# Patient Record
Sex: Female | Born: 1985 | Hispanic: Yes | Marital: Married | Smoking: Never smoker
Health system: Southern US, Community
[De-identification: ages and names within clinical notes are randomized; demographics above are authoritative.]

## PROBLEM LIST (undated history)

## (undated) ENCOUNTER — Inpatient Hospital Stay (HOSPITAL_COMMUNITY): Payer: Self-pay

## (undated) DIAGNOSIS — G43909 Migraine, unspecified, not intractable, without status migrainosus: Secondary | ICD-10-CM

## (undated) DIAGNOSIS — N289 Disorder of kidney and ureter, unspecified: Secondary | ICD-10-CM

## (undated) DIAGNOSIS — D649 Anemia, unspecified: Secondary | ICD-10-CM

## (undated) HISTORY — PX: WISDOM TOOTH EXTRACTION: SHX21

---

## 2013-06-26 ENCOUNTER — Emergency Department (HOSPITAL_COMMUNITY)
Admission: EM | Admit: 2013-06-26 | Discharge: 2013-06-26 | Disposition: A | Discharge status: Home / Self Care | Payer: Self-pay | Attending: Emergency Medicine | Admitting: Emergency Medicine

## 2013-06-26 ENCOUNTER — Encounter (HOSPITAL_COMMUNITY): Payer: Self-pay | Admitting: Emergency Medicine

## 2013-06-26 DIAGNOSIS — L738 Other specified follicular disorders: Secondary | ICD-10-CM | POA: Insufficient documentation

## 2013-06-26 DIAGNOSIS — N750 Cyst of Bartholin's gland: Secondary | ICD-10-CM | POA: Insufficient documentation

## 2013-06-26 DIAGNOSIS — L739 Follicular disorder, unspecified: Secondary | ICD-10-CM

## 2013-06-26 MED ORDER — HYDROCODONE-ACETAMINOPHEN 5-325 MG PO TABS
1.0000 | ORAL_TABLET | Freq: Once | ORAL | Status: AC
  Administered 2013-06-26: 1 via ORAL
  Filled 2013-06-26: qty 1

## 2013-06-26 MED ORDER — CLINDAMYCIN HCL 150 MG PO CAPS: 450.0000 mg | ORAL_CAPSULE | Freq: Four times a day (QID) | ORAL | Status: DC

## 2013-06-26 NOTE — ED Provider Notes (Signed)
CSN: [id number]     Arrival date & time 06/26/13  1021 History  This chart was scribed for non-physician practitioner, Raymon Mutton, PA-C, working with Juliet Rude. Rubin Payor, MD by Shari Heritage, ED Scribe. This patient was seen in room TR08C/TR08C and the patient's care was started at 12:56 PM.    Chief Complaint  Patient presents with  . Abscess    The history is provided by the patient. No language interpreter was used.    HPI Comments: Carmen Wheeler is a 27 y.o. female who presents to the Emergency Department complaining of multiple worsening painful non-draining abscesses to her labia onset 3-4 days ago. She describes current pain as severe. She states that these areas are hard to the touch. There is no associated fever. She reports no relieving factors. She denies nausea, vomiting, dysuria, abdominal pain, chest pain, shortness of breath, chills, neck pain, or other symptoms. She says that she has no history of the same. She has no chronic medical conditions.    No past medical history on file. Past Surgical History  Procedure Laterality Date  . Cesarean section     No family history on file. History  Substance Use Topics  . Smoking status: Not on file  . Smokeless tobacco: Not on file  . Alcohol Use: Not on file   OB History   Grav Para Term Preterm Abortions TAB SAB Ect Mult Living                 Review of Systems  Constitutional: Negative for fever and chills.  Respiratory: Negative for shortness of breath.   Cardiovascular: Negative for chest pain.  Gastrointestinal: Negative for nausea, vomiting and abdominal pain.  Musculoskeletal: Negative for neck pain.       Positive for abscesses to labia.   Neurological: Negative for dizziness.  All other systems reviewed and are negative.    Allergies  Review of patient's allergies indicates no known allergies.  Home Medications   Current Outpatient Rx  Name  Route  Sig  Dispense  Refill  . acetaminophen  (TYLENOL) 325 MG tablet   Oral   Take 650 mg by mouth every 6 (six) hours as needed for mild pain or headache.         . levonorgestrel (MIRENA) 20 MCG/24HR IUD   Intrauterine   1 each by Intrauterine route once.         . clindamycin (CLEOCIN) 150 MG capsule   Oral   Take 3 capsules (450 mg total) by mouth 4 (four) times daily.   90 capsule   0    Triage Vitals: BP 108/51  Pulse 77  Temp(Src) 98.3 F (36.8 C) (Oral)  Resp 18  SpO2 100% Physical Exam  Nursing note and vitals reviewed. Constitutional: She is oriented to person, place, and time. She appears well-developed and well-nourished. No distress.  HENT:  Head: Normocephalic and atraumatic.  Neck: Normal range of motion. Neck supple.  Cardiovascular: Normal rate, regular rhythm and normal heart sounds.  Exam reveals no friction rub.   No murmur heard. Pulmonary/Chest: Effort normal and breath sounds normal. No respiratory distress. She has no wheezes. She has no rales.  Genitourinary:  Approximately 2 cm x 2 cm indurated abscess localized to the left Bartholin's cyst region. Indurated, extremely hard to the touch. Negative surrounding erythema. Discomfort upon palpation. Negative active drainage or bleeding noted.  Neurological: She is alert and oriented to person, place, and time. She exhibits normal muscle tone. Coordination  normal.  Skin: Skin is warm and dry. No rash noted. She is not diaphoretic. No erythema.  Psychiatric: She has a normal mood and affect. Her behavior is normal. Thought content normal.    ED Course  Procedures (including critical care time) DIAGNOSTIC STUDIES: Oxygen Saturation is 100% on room air, normal by my interpretation.    COORDINATION OF CARE: 1:00 PM- Patient informed of current plan for treatment and evaluation and agrees with plan at this time.   INCISION AND DRAINAGE Performed by: Raymon Mutton Consent: Verbal consent obtained. Risks and benefits: risks, benefits and  alternatives were discussed Type: abscess Body area: Left Bartholin abscess Anesthesia: local infiltration Incision was made with a scalpel. Local anesthetic: lidocaine 1 % without epinephrine Anesthetic total: 5 ml Complexity: complex Blunt dissection to break up loculations Drainage: purulent and blood Drainage amount: 6-8 Patient tolerance: Patient tolerated the procedure well with no immediate complications.     Labs Review Labs Reviewed - No data to display Imaging Review No results found.  EKG Interpretation   None       MDM   1. Bartholin gland cyst   2. Folliculitis    Filed Vitals:   06/26/13 1027  BP: 108/51  Pulse: 77  Temp: 98.3 F (36.8 C)  TempSrc: Oral  Resp: 18  SpO2: 100%   I personally performed the services described in this documentation, which was scribed in my presence. The recorded information has been reviewed and is accurate.  Patient presenting to emergency department with Bartholin cyst abscess. Alert and oriented. Approximately 2 cm x 2 cm Bartholin cyst abscess to the left labial. Indurated upon palpation. Negative surrounding erythema noted. Discomfort upon palpation. Incision and drainage performed status blood and platelet fluid identified-hemostat used to break up loculations. Wound irrigated. Patient tolerated procedure well. Site covered with gauze. Patient stable, afebrile. Discharged patient with antibiotics. Discussed with patient hot compressions and massage. Recommended patient to followup report back to emergency department next week to be reassessed. Discussed with patient to closely monitor symptoms and if symptoms are to worsen or change report back to emergency department  - strict return structures given. Patient agreed to plan of care, understood, all questions answered.   Raymon Mutton, PA-C 06/26/13 1630

## 2013-06-26 NOTE — ED Notes (Signed)
PA at bedside.

## 2013-06-26 NOTE — ED Notes (Signed)
Pt has bumps on her labia, one she reports to be very large and painful. Afebrile. No drainage reported.

## 2013-06-26 NOTE — ED Notes (Signed)
Pt brought back to FT 8 until a stretcher room comes available.

## 2013-06-29 NOTE — ED Provider Notes (Signed)
Medical screening examination/treatment/procedure(s) were performed by non-physician practitioner and as supervising physician I was immediately available for consultation/collaboration.  EKG Interpretation   None        Rocket Gunderson R. Markevius Trombetta, MD 06/29/13 0835 

## 2014-06-22 ENCOUNTER — Emergency Department (HOSPITAL_COMMUNITY)
Admission: EM | Admit: 2014-06-22 | Discharge: 2014-06-22 | Disposition: A | Discharge status: Home / Self Care | Payer: Self-pay | Attending: Emergency Medicine | Admitting: Emergency Medicine

## 2014-06-22 ENCOUNTER — Encounter (HOSPITAL_COMMUNITY): Payer: Self-pay | Admitting: *Deleted

## 2014-06-22 ENCOUNTER — Emergency Department (HOSPITAL_COMMUNITY): Payer: Self-pay

## 2014-06-22 DIAGNOSIS — R200 Anesthesia of skin: Secondary | ICD-10-CM | POA: Insufficient documentation

## 2014-06-22 DIAGNOSIS — M79672 Pain in left foot: Secondary | ICD-10-CM | POA: Insufficient documentation

## 2014-06-22 DIAGNOSIS — Z791 Long term (current) use of non-steroidal anti-inflammatories (NSAID): Secondary | ICD-10-CM | POA: Insufficient documentation

## 2014-06-22 MED ORDER — MELOXICAM 7.5 MG PO TABS: 7.5000 mg | ORAL_TABLET | Freq: Every day | ORAL | Status: DC

## 2014-06-22 MED ORDER — TRAMADOL HCL 50 MG PO TABS: 50.0000 mg | ORAL_TABLET | Freq: Four times a day (QID) | ORAL | Status: DC | PRN

## 2014-06-22 NOTE — ED Notes (Addendum)
left. Foot (ant.) swelling - feels like pins and needles, cramping, numbing, and feels cold. Unilateral paraesthesia.

## 2014-06-22 NOTE — ED Notes (Signed)
Pt c/o pain to top of left foot x's 1-2 weeks.  No known injury.  Pt st's in the mornings when she woke up feels like pins and needles in bottom of foot.

## 2014-06-22 NOTE — ED Notes (Signed)
Talked with Dr. Jeraldine LootsLockwood - pt. Fast track appropriate.

## 2014-06-22 NOTE — ED Provider Notes (Signed)
CSN: [id number]     Arrival date & time 06/22/14  1651 History  This chart was scribed for non-physician practitioner, Terri Piedraourtney Forcucci, PA-C, working with Layla MawKristen N Ward, DO, by Bronson CurbJacqueline Melvin, ED Scribe. This patient was seen in room TR06C/TR06C and the patient's care was started at 7:53 PM.   Chief Complaint  Patient presents with  . Foot Pain    The history is provided by the patient. No language interpreter was used.     HPI Comments: Carmen Wheeler is a 28 y.o. female who presents to the Emergency Department complaining of constant, pain to the top of left foot for the past week. She states the pain started as mild tenderness, but has gotten progressively worse. She denies injury, athletic lifestyle, or any changes in her daily activities. There is associated swelling, redness, and paresthesia every morning while weight bearing. She has tried massages, applying ice, and Icy Hot, and Tylenol without significant improvement. She denies fever, chills, nausea, vomiting, or back pain.  Patient is not established with a PCP. NKA to any mediations.   History reviewed. No pertinent past medical history. Past Surgical History  Procedure Laterality Date  . Cesarean section     History reviewed. No pertinent family history. History  Substance Use Topics  . Smoking status: Never Smoker   . Smokeless tobacco: Not on file  . Alcohol Use: No   OB History    No data available     Review of Systems  Constitutional: Negative for fever and chills.  Gastrointestinal: Negative for nausea and vomiting.  Musculoskeletal: Positive for myalgias (left foot). Negative for back pain.  Neurological: Positive for numbness.  All other systems reviewed and are negative.     Allergies  Review of patient's allergies indicates no known allergies.  Home Medications   Prior to Admission medications   Medication Sig Start Date End Date Taking? Authorizing Provider  acetaminophen (TYLENOL)  325 MG tablet Take 650 mg by mouth every 6 (six) hours as needed for mild pain or headache.   Yes Historical Provider, MD  medroxyPROGESTERone (DEPO-PROVERA) 150 MG/ML injection Inject 150 mg into the muscle every 3 (three) months.   Yes Historical Provider, MD  meloxicam (MOBIC) 7.5 MG tablet Take 1 tablet (7.5 mg total) by mouth daily. 06/22/14   Beryl Hornberger A Forcucci, PA-C  traMADol (ULTRAM) 50 MG tablet Take 1 tablet (50 mg total) by mouth every 6 (six) hours as needed. 06/22/14   Eloise Picone A Forcucci, PA-C   Triage Vitals: BP 100/61 mmHg  Pulse 82  Temp(Src) 98.6 F (37 C) (Oral)  Resp 15  SpO2 100%  Physical Exam  Constitutional: She is oriented to person, place, and time. She appears well-developed and well-nourished. No distress.  HENT:  Head: Normocephalic and atraumatic.  Mouth/Throat: Oropharynx is clear and moist. No oropharyngeal exudate.  Eyes: Conjunctivae and EOM are normal. Pupils are equal, round, and reactive to light.  Neck: Normal range of motion. Neck supple. No JVD present. No thyromegaly present.  Cardiovascular: Normal rate, regular rhythm, normal heart sounds and intact distal pulses.  Exam reveals no gallop and no friction rub.   No murmur heard. Pulses:      Dorsalis pedis pulses are 2+ on the right side, and 2+ on the left side.       Posterior tibial pulses are 2+ on the right side, and 2+ on the left side.  2+ DP and PT pulses bilaterally.  Pulmonary/Chest: Effort normal and breath sounds normal.  No respiratory distress.  Abdominal: Soft. Bowel sounds are normal.  Musculoskeletal: Normal range of motion.       Left ankle: She exhibits normal range of motion, no swelling, no ecchymosis, no deformity, no laceration and normal pulse. Tenderness. No lateral malleolus, no medial malleolus, no AITFL, no CF ligament, no posterior TFL, no head of 5th metatarsal and no proximal fibula tenderness found.       Feet:  Lymphadenopathy:    She has no cervical adenopathy.   Neurological: She is alert and oriented to person, place, and time.  Skin: Skin is warm and dry.  Psychiatric: She has a normal mood and affect. Her behavior is normal. Judgment and thought content normal.  Nursing note and vitals reviewed.   ED Course  Procedures (including critical care time)  DIAGNOSTIC STUDIES: Oxygen Saturation is 100% on room air, normal by my interpretation.    COORDINATION OF CARE: At [id number] Discussed treatment plan with patient which includes X-ray to rule out possible fracture, anti-inflammatory medication.  Patient also advised to refrain from sitting with legs crossed. Patient agrees.   Labs Review Labs Reviewed - No data to display  Imaging Review Dg Foot Complete Left  06/22/2014   CLINICAL DATA:  Pt states pains across the anterior mid foot, when she walks it is more painful, feels pins and needles after a long day on her feet, she has a standing job, no visible sts, mk rt-r  EXAM: LEFT FOOT - COMPLETE 3+ VIEW  COMPARISON:  None.  FINDINGS: There is no evidence of fracture or dislocation. There is no evidence of arthropathy or other focal bone abnormality. Soft tissues are unremarkable.  IMPRESSION: Negative.   Electronically Signed   By: Elberta Fortisaniel  Boyle M.D.   On: 06/22/2014 20:48     EKG Interpretation None      MDM   Final diagnoses:  Foot pain, left   Patient is a 28 y.o. Female who presents to the ED with left foot pain.  Physical exam reveals a neurovascularly intact left foot.  Plain film xray is negative. Suspect that parasthesias of the foot are likely due to leg crossing and have recommended that the patient does not cross legs.  Suspect foot sprain vs. Lis franc injury.  Patient is stable for discharge will place her on daily mobic and will have her follow-up with ortho if continued pain in 1 week.  Patient states understanding and agreement at this time.    I personally performed the services described in this documentation, which was  scribed in my presence. The recorded information has been reviewed and is accurate.     Eben Burowourtney A Forcucci, PA-C 06/22/14 2115  Layla MawKristen N Ward, DO 06/23/14 0005

## 2014-06-22 NOTE — Discharge Instructions (Signed)
Foot Sprain The muscles and cord like structures which attach muscle to bone (tendons) that surround the feet are made up of units. A foot sprain can occur at the weakest spot in any of these units. This condition is most often caused by injury to or overuse of the foot, as from playing contact sports, or aggravating a previous injury, or from poor conditioning, or obesity. SYMPTOMS  Pain with movement of the foot.  Tenderness and swelling at the injury site.  Loss of strength is present in moderate or severe sprains. THE THREE GRADES OR SEVERITY OF FOOT SPRAIN ARE:  Mild (Grade I): Slightly pulled muscle without tearing of muscle or tendon fibers or loss of strength.  Moderate (Grade II): Tearing of fibers in a muscle, tendon, or at the attachment to bone, with small decrease in strength.  Severe (Grade III): Rupture of the muscle-tendon-bone attachment, with separation of fibers. Severe sprain requires surgical repair. Often repeating (chronic) sprains are caused by overuse. Sudden (acute) sprains are caused by direct injury or over-use. DIAGNOSIS  Diagnosis of this condition is usually by your own observation. If problems continue, a caregiver may be required for further evaluation and treatment. X-rays may be required to make sure there are not breaks in the bones (fractures) present. Continued problems may require physical therapy for treatment. PREVENTION  Use strength and conditioning exercises appropriate for your sport.  Warm up properly prior to working out.  Use athletic shoes that are made for the sport you are participating in.  Allow adequate time for healing. Early return to activities makes repeat injury more likely, and can lead to an unstable arthritic foot that can result in prolonged disability. Mild sprains generally heal in 3 to 10 days, with moderate and severe sprains taking 2 to 10 weeks. Your caregiver can help you determine the proper time required for  healing. HOME CARE INSTRUCTIONS   Apply ice to the injury for 15-20 minutes, 03-04 times per day. Put the ice in a plastic bag and place a towel between the bag of ice and your skin.  An elastic wrap (like an Ace bandage) may be used to keep swelling down.  Keep foot above the level of the heart, or at least raised on a footstool, when swelling and pain are present.  Try to avoid use other than gentle range of motion while the foot is painful. Do not resume use until instructed by your caregiver. Then begin use gradually, not increasing use to the point of pain. If pain does develop, decrease use and continue the above measures, gradually increasing activities that do not cause discomfort, until you gradually achieve normal use.  Use crutches if and as instructed, and for the length of time instructed.  Keep injured foot and ankle wrapped between treatments.  Massage foot and ankle for comfort and to keep swelling down. Massage from the toes up towards the knee.  Only take over-the-counter or prescription medicines for pain, discomfort, or fever as directed by your caregiver. SEEK IMMEDIATE MEDICAL CARE IF:   Your pain and swelling increase, or pain is not controlled with medications.  You have loss of feeling in your foot or your foot turns cold or blue.  You develop new, unexplained symptoms, or an increase of the symptoms that brought you to your caregiver. MAKE SURE YOU:   Understand these instructions.  Will watch your condition.  Will get help right away if you are not doing well or get worse. Document Released:  01/19/2002 Document Revised: 10/22/2011 Document Reviewed: 03/18/2008 °ExitCare® Patient Information ©2015 [employer]. This information is not intended to replace advice given to you by your health care provider. Make sure you discuss any questions you have with your health care provider. ° ° °Emergency Department Resource Guide °1) Find a Doctor and Pay Out of  Pocket °Although you won't have to find out who is covered by your insurance plan, it is a good idea to ask around and get recommendations. You will then need to call the office and see if the doctor you have chosen will accept you as a new patient and what types of options they offer for patients who are self-pay. Some doctors offer discounts or will set up payment plans for their patients who do not have insurance, but you will need to ask so you aren't surprised when you get to your appointment. ° °2) Contact Your Local Health Department °Not all health departments have doctors that can see patients for sick visits, but many do, so it is worth a call to see if yours does. If you don't know where your local health department is, you can check in your phone book. The CDC also has a tool to help you locate your state's health department, and many state websites also have listings of all of their local health departments. ° °3) Find a Walk-in Clinic °If your illness is not likely to be very severe or complicated, you may want to try a walk in clinic. These are popping up all over the country in pharmacies, drugstores, and shopping centers. They're usually staffed by nurse practitioners or physician assistants that have been trained to treat common illnesses and complaints. They're usually fairly quick and inexpensive. However, if you have serious medical issues or chronic medical problems, these are probably not your best option. ° °No Primary Care Doctor: °- Call Health Connect at  832-8000 - they can help you locate a primary care doctor that  accepts your insurance, provides certain services, etc. °- Physician Referral Service- 1-800-533-3463 ° °Chronic Pain Problems: °Organization         Address  Phone   Notes  °[facility]  ([phone] Patients need to be referred by their primary care doctor.  ° °Medication Assistance: °Organization         Address  Phone   Notes  °[place]  Medication Assistance [facility] [address] °[place], [place] [address] ([phone]) 641-8030 --Must be a resident of [place] °-- Must have NO insurance coverage whatsoever (no Medicaid/ Medicare, etc.) °-- The pt. MUST have a primary care doctor that directs their care regularly and follows them in the community °  °Med[facility]ist  (866) 331-1348   °United Way  (888) 892-1162   ° °Agencies that provide inexpensive medical care: °Organization         Address  Phone   Notes  °[facility]  ([phone]) [phone]   ° Internal Medicine    (336) 832-7272   °[facility] [address] °[place], [place] [address] ([phone]) 832-4777   °[facility] [address], °Opp (336) 271-4999   °Planned Parenthood    (336) 373-0678   °[facility]    (336) 272-1050   °[facility] ° [address], [place] Phone:  (336) [phone], Fax:  ([phone]) [phone] Hours of Operation:  9 am - 6 pm, M-F.  Also   accepts Medicaid/Medicare and self-pay.  [facility] for [place], [address], [place] Phone: [phone], Fax: [phone]. Hours of Operation:  8:30 am - 5:30 pm, M-F.  Also accepts Medicaid and self-pay.  [facility] [address], [place] Phone: [phone]   [place], [place], [place] [phone], Ext. [phone] Mondays & Thursdays: 7-9 AM.  First 15 patients are seen on a first come, first serve basis.    [place] Providers:  Organization         Address  Phone   Notes  [facility] [address], [place] [phone] Also accepts self-pay patients.  [facility] [address], [place]  ([phone]   [place], [address], [place] [phone]   [facility] [address] [place] [phone]   Lucianne Lei [address], [place]   [phone] Only accepts [place] Access [place] patients after they have their name applied to their card.   Self-Pay (no insurance) in [facility]:  Organization         Address  Phone   Notes  Sickle Cell Patients, [facility] [place] [phone]   [facility] [place] [phone]   [facility]  [place], [place], [place] [phone]   [facility]/Dr. Osei-Bonsu  [address], [place] or [address], [place] [phone] Phone number for both [place] and [place] locations is the same.  [facility] [address] [place] [phone]   [facility] [address] [place] or [address] [phone]   [facility] [address] [place] [phone], phone; [phone], fax Sees patients 1st and 3rd Saturday of every month.  Must not qualify for public or private insurance (i.e. Medicaid, Medicare, [place] Health Choice, Veterans' Benefits)  Household income should be no more than 200% of the poverty level The clinic cannot treat you if you are pregnant or think you are pregnant  Sexually transmitted diseases are not treated at the clinic.    Dental Care: Organization         Address  Phone  Notes  [facility] [place] [phone] Accepts children up to age 47 who are enrolled in [place] or [place]; pregnant women with a Medicaid card; and children who have applied for Medicaid or [place] Health Choice, but were declined, whose parents can pay a reduced fee at time of service.  [facility] [place] [facility]  [address], [place]  [phone] Accepts children up to age 72 who are enrolled in [place] or [place]; pregnant women with a Medicaid card; and children who have applied for Medicaid or [place] Health Choice, but were declined, whose parents can pay a reduced fee at time of service.  [facility]  [place] [phone] Patients are seen by appointment only. Walk-ins are not accepted. [place] will see patients 43 years of age and older. Monday - Tuesday (8am-5pm)  Most Wednesdays (8:30-5pm) $30 per visit, cash only  [facility] [employer] PRO[facility]  [address], [facility] [phone] Patients are seen by appointment only. Walk-ins are not accepted. [place] will see patients 68 years of age and older. One Wednesday Evening (Monthly: Volunteer Based).  $30 per visit, cash only  [place]  [phone] for adults; Children under age 54, call Graduate Pediatric Dentistry at [phone]. Children aged 70-14, please call [phone] to request a pediatric application.  Dental services are provided in all areas of dental care including fillings, crowns and bridges, complete and partial dentures, implants, gum treatment, root canals, and extractions. Preventive care is also provided. Treatment is provided to both adults and children. Patients are selected via a lottery and there is often a waiting list.   [facility] [place] [address] [place]  [phone] [email or web address]   [facility] [address] [place], [place] [phone], Ext. [phone] Second and Fourth Thursday of each month, opens at 6:30 AM; Clinic ends at 9 AM.  Patients are seen on a first-come first-served basis, and a limited number are seen during each clinic.   [facility]  [address] Hillard Danker [place], [place] [phone]   Eligibility Requirements You must have lived in [place], [place], or [place]  counties for at least the last three months.   You cannot be eligible for state or federal sponsored [employer], including [employer], [place], or [employer].   You generally cannot be eligible for healthcare insurance through your employer.    How to apply: Eligibility screenings are held every Tuesday and Wednesday afternoon from 1:00 pm until 4:00 pm. You do not need an appointment for the interview!  [facility] [address] [place], [place]   [place]  [place] Department  [place]  [phone]    Behavioral Health Resources in the Community: Intensive Outpatient Programs Organization         Address  Phone  Notes  [place] [address] [place], [place] [phone]   [facility] [address] [place], [place]   [facility]: [facility] [address] [place], [place]   [place] [address]  [place], [place] or [phone]   Substance Abuse Resources Organization         Address  Phone  Notes  Al[facility] Drug Services  7162251982   [place]  [phone]   [facility] [place]   Chinita Pester  [phone]   Residential & Outpatient Substance Abuse Program  [phone]   Psychological Services Organization         Address  Phone  Notes  [facility] [place]  [place]  ([phone]   [place] [address], [place] or [phone]    Mobile Crisis Teams Organization         Address  Phone  Notes  [facility], Mobile Crisis Care Unit  [phone]   [facility]  [address] [place], [place]   [place] [address], [place] [phone]    Self-Help/Support Groups Organization         Address  Phone  Notes  [facility] [place] - variety of support groups  336- I7437963 Call for more information  Narcotics Anonymous (NA), [facility] [address], [employer] [place]  2 meetings at this location   Statistician         Address  Phone  Notes  [facility] [address],    [place]  [phone]   [facility]  [address] [place] [address], [place], [place] [phone]   [facility] [address] [place], [place] [phone] Admissions: 8am-3pm M-F  [facility] [address]    [place], [place] [phone]   [facility] [address] [place], [place], [place] [phone]   [facility] [address]  [place], [place] [phone]   [facility] - Intensive Outpatient [address]ure[address], [place], [place] [phone]   [facility] [address] [place].,  In[place], [place] [phone] or [phone]   [facility] ([facility]) [address] [place], [place] [phone] Accepts Medicaid  [facility] [place] [address],  [place] [phone] Substance Abuse/Addiction Treatment   [facility] Organization         Address  Phone  Notes  [facility]  (820) [phone]   Angie Fava, PhD [address] Ervin Knack [place], [place]   (828) [phone] or [phone]   [facility] Behavioral   [address] [place], [place] [phone]   [facility] [address], [place], [place] [phone] Insurance/Medicaid/sponsorship through [facility] and Families [address]                                    [place], [place] [phone] Therapy/tele-psych/case  [facility] [address], [place] [phone]    Dr. Lolly Mustache  [phone]   [facility] [place]  United Way [facility] Dept. 1) [address],  2) [address], [place] 3)  [address], [place] [phone]  [phone]   [facility] Child Abuse Hotline 575-805-8158 or ([phone] (After Hours)

## 2015-03-12 ENCOUNTER — Emergency Department (HOSPITAL_COMMUNITY)
Admission: EM | Admit: 2015-03-12 | Discharge: 2015-03-12 | Disposition: A | Discharge status: Home / Self Care | Payer: BLUE CROSS/BLUE SHIELD | Attending: Emergency Medicine | Admitting: Emergency Medicine

## 2015-03-12 ENCOUNTER — Encounter (HOSPITAL_COMMUNITY): Payer: Self-pay | Admitting: *Deleted

## 2015-03-12 DIAGNOSIS — H538 Other visual disturbances: Secondary | ICD-10-CM | POA: Diagnosis not present

## 2015-03-12 DIAGNOSIS — Z3202 Encounter for pregnancy test, result negative: Secondary | ICD-10-CM | POA: Insufficient documentation

## 2015-03-12 DIAGNOSIS — R42 Dizziness and giddiness: Secondary | ICD-10-CM | POA: Insufficient documentation

## 2015-03-12 DIAGNOSIS — H5500 Unspecified nystagmus: Secondary | ICD-10-CM | POA: Insufficient documentation

## 2015-03-12 DIAGNOSIS — R51 Headache: Secondary | ICD-10-CM | POA: Diagnosis not present

## 2015-03-12 DIAGNOSIS — R112 Nausea with vomiting, unspecified: Secondary | ICD-10-CM | POA: Diagnosis not present

## 2015-03-12 LAB — CBC
HEMATOCRIT: 37.7 % (ref 36.0–46.0)
HEMOGLOBIN: 12.7 g/dL (ref 12.0–15.0)
MCH: 29.5 pg (ref 26.0–34.0)
MCHC: 33.7 g/dL (ref 30.0–36.0)
MCV: 87.5 fL (ref 78.0–100.0)
Platelets: 209 10*3/uL (ref 150–400)
RBC: 4.31 MIL/uL (ref 3.87–5.11)
RDW: 13 % (ref 11.5–15.5)
WBC: 9.9 10*3/uL (ref 4.0–10.5)

## 2015-03-12 LAB — BASIC METABOLIC PANEL
Anion gap: 9 (ref 5–15)
BUN: 13 mg/dL (ref 6–20)
CHLORIDE: 107 mmol/L (ref 101–111)
CO2: 23 mmol/L (ref 22–32)
Calcium: 9.2 mg/dL (ref 8.9–10.3)
Creatinine, Ser: 0.59 mg/dL (ref 0.44–1.00)
GFR calc Af Amer: >60 mL/min (ref >60)
Glucose, Bld: 91 mg/dL (ref 65–99)
POTASSIUM: 3.6 mmol/L (ref 3.5–5.1)
SODIUM: 139 mmol/L (ref 135–145)

## 2015-03-12 LAB — POC URINE PREG, ED: Preg Test, Ur: NEGATIVE

## 2015-03-12 MED ORDER — MECLIZINE HCL 25 MG PO TABS
25.0000 mg | ORAL_TABLET | Freq: Once | ORAL | Status: AC
  Administered 2015-03-12: 25 mg via ORAL
  Filled 2015-03-12: qty 1

## 2015-03-12 MED ORDER — MECLIZINE HCL 25 MG PO TABS: 25.0000 mg | ORAL_TABLET | Freq: Two times a day (BID) | ORAL | Status: DC | PRN

## 2015-03-12 NOTE — ED Notes (Signed)
Patient C/O dizziness that began 3 days ago.  States that it feels like the room spins even with her eyes closed.  States that the headache onset was yesterday.  She denies any trauma.  States that she became nauseated and vomited yesterday.

## 2015-03-12 NOTE — ED Notes (Signed)
Patient with reported onset of frontal headache with dizziness and nausea for 3 days.  She denies trauma.  She states she has periods of hot and cold as well.  She denies fever.  She reports she has also had blurred vision.  Patient denies neck pain.  She is alert and oriented.  Speech is clear

## 2015-03-12 NOTE — ED Provider Notes (Signed)
CSN: [id number]     Arrival date & time 03/12/15  1624 History   First MD Initiated Contact with Patient 03/12/15 1809     Chief Complaint  Patient presents with  . Headache  . Dizziness  . Nausea  . Blurred Vision     (Consider location/radiation/quality/duration/timing/severity/associated sxs/prior Treatment) Patient is a 29 y.o. female presenting with dizziness.  Dizziness Quality:  Room spinning Severity:  Moderate Onset quality:  Gradual Duration:  3 days Timing:  Constant Progression:  Waxing and waning Chronicity:  New Context comment:  "I was just sitting there" Relieved by:  Nothing Worsened by:  Lying down, turning head, standing up and closing eyes Associated symptoms: headaches, nausea, vision changes (occasional blurriness.  none currently.) and vomiting (yesterday)   Associated symptoms: no chest pain, no diarrhea, no hearing loss, no shortness of breath and no tinnitus     History reviewed. No pertinent past medical history. Past Surgical History  Procedure Laterality Date  . Cesarean section     No family history on file. History  Substance Use Topics  . Smoking status: Never Smoker   . Smokeless tobacco: Not on file  . Alcohol Use: Yes   OB History    No data available     Review of Systems  HENT: Negative for hearing loss and tinnitus.   Respiratory: Negative for shortness of breath.   Cardiovascular: Negative for chest pain.  Gastrointestinal: Positive for nausea and vomiting (yesterday). Negative for diarrhea.  Neurological: Positive for dizziness and headaches.  All other systems reviewed and are negative.     Allergies  Review of patient's allergies indicates no known allergies.  Home Medications   Prior to Admission medications   Medication Sig Start Date End Date Taking? Authorizing Provider  acetaminophen (TYLENOL) 325 MG tablet Take 650 mg by mouth every 6 (six) hours as needed for mild pain or headache.    Historical Provider,  MD  medroxyPROGESTERone (DEPO-PROVERA) 150 MG/ML injection Inject 150 mg into the muscle every 3 (three) months.    Historical Provider, MD  meloxicam (MOBIC) 7.5 MG tablet Take 1 tablet (7.5 mg total) by mouth daily. 06/22/14   Courtney Forcucci, PA-C  traMADol (ULTRAM) 50 MG tablet Take 1 tablet (50 mg total) by mouth every 6 (six) hours as needed. 06/22/14   Courtney Forcucci, PA-C   BP 116/66 mmHg  Pulse 84  Temp(Src) 98.9 F (37.2 C) (Oral)  Resp 18  Wt 114 lb (51.71 kg)  SpO2 100% Physical Exam  Constitutional: She is oriented to person, place, and time. She appears well-developed and well-nourished. No distress.  HENT:  Head: Normocephalic and atraumatic.  Mouth/Throat: Oropharynx is clear and moist.  Eyes: Conjunctivae are normal. Pupils are equal, round, and reactive to light. No scleral icterus.  Neck: Neck supple.  Cardiovascular: Normal rate, regular rhythm, normal heart sounds and intact distal pulses.   No murmur heard. Pulmonary/Chest: Effort normal and breath sounds normal. No stridor. No respiratory distress. She has no rales.  Abdominal: Soft. Bowel sounds are normal. She exhibits no distension. There is no tenderness.  Musculoskeletal: Normal range of motion.  Neurological: She is alert and oriented to person, place, and time. She has normal strength. No cranial nerve deficit or sensory deficit. Coordination and gait normal. GCS eye subscore is 4. GCS verbal subscore is 5. GCS motor subscore is 6.  Two beats of leftward nystagmus  Skin: Skin is warm and dry. No rash noted.  Psychiatric: She has a  normal mood and affect. Her behavior is normal.  Nursing note and vitals reviewed.   ED Course  Procedures (including critical care time) Labs Review Labs Reviewed  BASIC METABOLIC PANEL  CBC  URINALYSIS, ROUTINE W REFLEX MICROSCOPIC (NOT [facility])  POC URINE PREG, ED    Imaging Review No results found.   EKG Interpretation None      MDM   Final diagnoses:   Vertigo    29 yo female with room spinning dizziness for two days.  Neurologic exam reassuring.  Low risk for stroke.  Will attempt meclizine.    Symptoms resolved with meclizine.    Blake Divine, MD 03/12/15 2022

## 2015-03-12 NOTE — Discharge Instructions (Signed)

## 2015-03-12 NOTE — ED Notes (Signed)
Patient unable to give urine sample at this time

## 2015-03-27 ENCOUNTER — Encounter (HOSPITAL_COMMUNITY): Payer: Self-pay | Admitting: Emergency Medicine

## 2015-03-27 ENCOUNTER — Emergency Department (HOSPITAL_COMMUNITY)
Admission: EM | Admit: 2015-03-27 | Discharge: 2015-03-28 | Disposition: A | Discharge status: Home / Self Care | Payer: BLUE CROSS/BLUE SHIELD | Attending: Emergency Medicine | Admitting: Emergency Medicine

## 2015-03-27 DIAGNOSIS — N760 Acute vaginitis: Secondary | ICD-10-CM | POA: Diagnosis not present

## 2015-03-27 DIAGNOSIS — Z793 Long term (current) use of hormonal contraceptives: Secondary | ICD-10-CM | POA: Diagnosis not present

## 2015-03-27 DIAGNOSIS — R102 Pelvic and perineal pain: Secondary | ICD-10-CM | POA: Diagnosis present

## 2015-03-27 DIAGNOSIS — B9689 Other specified bacterial agents as the cause of diseases classified elsewhere: Secondary | ICD-10-CM

## 2015-03-27 LAB — URINALYSIS, ROUTINE W REFLEX MICROSCOPIC
Bilirubin Urine: NEGATIVE
GLUCOSE, UA: NEGATIVE mg/dL
Hgb urine dipstick: NEGATIVE
KETONES UR: NEGATIVE mg/dL
Leukocytes, UA: NEGATIVE
NITRITE: NEGATIVE
PH: 6.5 (ref 5.0–8.0)
PROTEIN: NEGATIVE mg/dL
SPECIFIC GRAVITY, URINE: 1.015 (ref 1.005–1.030)
Urobilinogen, UA: 1 mg/dL (ref 0.0–1.0)

## 2015-03-27 LAB — WET PREP, GENITAL
Trich, Wet Prep: NONE SEEN
Yeast Wet Prep HPF POC: NONE SEEN

## 2015-03-27 LAB — HCG, QUANTITATIVE, PREGNANCY

## 2015-03-27 MED ORDER — CEFTRIAXONE SODIUM 250 MG IJ SOLR
250.0000 mg | Freq: Once | INTRAMUSCULAR | Status: AC
  Administered 2015-03-27: 250 mg via INTRAMUSCULAR
  Filled 2015-03-27: qty 250

## 2015-03-27 MED ORDER — AZITHROMYCIN 250 MG PO TABS
1000.0000 mg | ORAL_TABLET | Freq: Once | ORAL | Status: AC
  Administered 2015-03-27: 1000 mg via ORAL
  Filled 2015-03-27: qty 4

## 2015-03-27 MED ORDER — STERILE WATER FOR INJECTION IJ SOLN: 0.9000 mL | Freq: Once | INTRAMUSCULAR | Status: DC

## 2015-03-27 MED ORDER — LIDOCAINE HCL (PF) 1 % IJ SOLN
INTRAMUSCULAR | Status: AC
  Filled 2015-03-27: qty 5

## 2015-03-27 MED ORDER — LIDOCAINE HCL (PF) 1 % IJ SOLN
0.9000 mL | Freq: Once | INTRAMUSCULAR | Status: AC
  Administered 2015-03-27: 0.9 mL

## 2015-03-27 NOTE — ED Provider Notes (Signed)
CSN: [id number]     Arrival date & time 03/27/15  1918 History   First MD Initiated Contact with Patient 03/27/15 2201     Chief Complaint  Patient presents with  . Vaginal Pain     (Consider location/radiation/quality/duration/timing/severity/associated sxs/prior Treatment) Patient is a 29 y.o. female presenting with vaginal pain. The history is provided by the patient and medical records.  Vaginal Pain   30 year old female with no significant past medical history presenting to the ED for vaginal irritation and discharge for the past 4 days.  Patient states she has vaginal itching, burning, and discharge that appears white/green in color.  She denies abdominal pain, pelvic pain, nausea, vomiting, diarrhea.  She reports some intermittent dysuria but denies hematuria.  No fever, chills.  Patient does admit to new sexual partner, some concern for STD.  No hx of STD in the past.  Patient is on depo, not concerned for pregnancy at this time.  VSS.  History reviewed. No pertinent past medical history. Past Surgical History  Procedure Laterality Date  . Cesarean section     No family history on file. Social History  Substance Use Topics  . Smoking status: Never Smoker   . Smokeless tobacco: None  . Alcohol Use: Yes   OB History    No data available     Review of Systems  Genitourinary: Positive for vaginal discharge and vaginal pain.  All other systems reviewed and are negative.     Allergies  Review of patient's allergies indicates no known allergies.  Home Medications   Prior to Admission medications   Medication Sig Start Date End Date Taking? Authorizing Provider  acetaminophen (TYLENOL) 325 MG tablet Take 650 mg by mouth 2 (two) times daily as needed for mild pain or headache.    Yes Historical Provider, MD  medroxyPROGESTERone (DEPO-PROVERA) 150 MG/ML injection Inject 150 mg into the muscle every 3 (three) months. Last injection July 2016   Yes Historical Provider, MD   meclizine (ANTIVERT) 25 MG tablet Take 1 tablet (25 mg total) by mouth 2 (two) times daily as needed for dizziness. Patient not taking: Reported on 03/27/2015 03/12/15   Blake Divine, MD  meloxicam (MOBIC) 7.5 MG tablet Take 1 tablet (7.5 mg total) by mouth daily. Patient not taking: Reported on 03/12/2015 06/22/14   Toni Amend Forcucci, PA-C  traMADol (ULTRAM) 50 MG tablet Take 1 tablet (50 mg total) by mouth every 6 (six) hours as needed. Patient not taking: Reported on 03/12/2015 06/22/14   Courtney Forcucci, PA-C   BP 115/58 mmHg  Pulse 74  Temp(Src) 98.6 F (37 C) (Oral)  Resp 16  Ht 4\' 11"  (1.499 m)  Wt 114 lb 3.2 oz (51.801 kg)  BMI 23.05 kg/m2  SpO2 98%   Physical Exam  Constitutional: She is oriented to person, place, and time. She appears well-developed and well-nourished. No distress.  HENT:  Head: Normocephalic and atraumatic.  Mouth/Throat: Oropharynx is clear and moist.  Eyes: Conjunctivae and EOM are normal. Pupils are equal, round, and reactive to light.  Neck: Normal range of motion. Neck supple.  Cardiovascular: Normal rate, regular rhythm and normal heart sounds.   Pulmonary/Chest: Effort normal and breath sounds normal. No respiratory distress. She has no wheezes.  Abdominal: Soft. Bowel sounds are normal. There is no tenderness. There is no guarding.  Genitourinary: There is no lesion on the right labia. There is no lesion on the left labia. Cervix exhibits no motion tenderness. Right adnexum displays no tenderness. Left  adnexum displays no tenderness. No bleeding in the vagina. No foreign body around the vagina. Vaginal discharge found.  Normal female external genitalia without visible lesions or rash, moderate amount of white vaginal discharge present, no vaginal bleeding or foreign body, no adnexal or cervical motion tenderness  Musculoskeletal: Normal range of motion. She exhibits no edema.  Neurological: She is alert and oriented to person, place, and time.  Skin:  Skin is warm and dry. She is not diaphoretic.  Psychiatric: She has a normal mood and affect.  Nursing note and vitals reviewed.   ED Course  Procedures (including critical care time) Labs Review Labs Reviewed  WET PREP, GENITAL - Abnormal; Notable for the following:    Clue Cells Wet Prep HPF POC MANY (*)    WBC, Wet Prep HPF POC FEW (*)    All other components within normal limits  URINALYSIS, ROUTINE W REFLEX MICROSCOPIC (NOT AT [facility])  HCG, QUANTITATIVE, PREGNANCY  GC/CHLAMYDIA PROBE AMP ([place]) NOT AT [facility]    Imaging Review No results found. Meribeth Mattes, LISA M, personally reviewed and evaluated these lab results as part of my medical decision-making.   EKG Interpretation None      MDM   Final diagnoses:  Bacterial vaginosis   29 year old female here with vaginal irritation and discharge for the past 4 days. She does admit to new sexual partner recently. Patient is afebrile, nontoxic.  Abdominal exam is benign. She has moderate amount of white vaginal discharge present that is consistent with BV. No adnexal or CMT.  Wet prep does confirm many clue cells.  Gc/chl pending.  Given her new sexual partners, will treat presumptively with rocephin and azithromycin.  Rx flagyl-- encouraged not to drink EtOH while taking this.  FU with women's clinic.  Discussed plan with patient, he/she acknowledged understanding and agreed with plan of care.  Return precautions given for new or worsening symptoms.  Garlon Hatchet, PA-C 03/28/15 0026  Rolland Porter, MD 04/04/15 [phone]

## 2015-03-27 NOTE — ED Notes (Signed)
C/o vaginal burning, itching, and green discharge x 4 days.

## 2015-03-28 LAB — GC/CHLAMYDIA PROBE AMP (~~LOC~~) NOT AT ARMC
Chlamydia: NEGATIVE
Neisseria Gonorrhea: NEGATIVE

## 2015-03-28 MED ORDER — METRONIDAZOLE 500 MG PO TABS: 500.0000 mg | ORAL_TABLET | Freq: Two times a day (BID) | ORAL | Status: DC

## 2015-03-28 NOTE — Discharge Instructions (Signed)
Take the prescribed medication as directed.  Do not drink alcohol while taking this medication, it will make you sick. You will be notified if your STD cultures are positive, if you so you will need to notify partner so they can be tested however you have already been treated. Follow-up with women's clinic for further OB-GYN needs. Return to the ED for new or worsening symptoms.

## 2015-07-13 ENCOUNTER — Encounter (HOSPITAL_COMMUNITY): Payer: Self-pay | Admitting: Neurology

## 2015-07-13 ENCOUNTER — Emergency Department (HOSPITAL_COMMUNITY)
Admission: EM | Admit: 2015-07-13 | Discharge: 2015-07-13 | Disposition: A | Discharge status: Home / Self Care | Payer: BLUE CROSS/BLUE SHIELD | Attending: Emergency Medicine | Admitting: Emergency Medicine

## 2015-07-13 DIAGNOSIS — Z792 Long term (current) use of antibiotics: Secondary | ICD-10-CM | POA: Insufficient documentation

## 2015-07-13 DIAGNOSIS — Z3202 Encounter for pregnancy test, result negative: Secondary | ICD-10-CM | POA: Insufficient documentation

## 2015-07-13 DIAGNOSIS — R350 Frequency of micturition: Secondary | ICD-10-CM | POA: Diagnosis present

## 2015-07-13 DIAGNOSIS — N390 Urinary tract infection, site not specified: Secondary | ICD-10-CM | POA: Diagnosis not present

## 2015-07-13 LAB — URINALYSIS, ROUTINE W REFLEX MICROSCOPIC
Bilirubin Urine: NEGATIVE
Glucose, UA: NEGATIVE mg/dL
KETONES UR: NEGATIVE mg/dL
NITRITE: NEGATIVE
PH: 5.5 (ref 5.0–8.0)
Protein, ur: 100 mg/dL — AB
Specific Gravity, Urine: 1.026 (ref 1.005–1.030)

## 2015-07-13 LAB — URINE MICROSCOPIC-ADD ON

## 2015-07-13 LAB — POC URINE PREG, ED: PREG TEST UR: NEGATIVE

## 2015-07-13 MED ORDER — CIPROFLOXACIN HCL 500 MG PO TABS: 500.0000 mg | ORAL_TABLET | Freq: Two times a day (BID) | ORAL | Status: DC

## 2015-07-13 NOTE — ED Notes (Signed)
Pt able to ambulate independently 

## 2015-07-13 NOTE — Discharge Instructions (Signed)
You were treated in the ED today for UTI. Please follow-up with your doctor as needed for reevaluation. Take her antibiotics as prescribed. Return to ED for any worsening symptoms.  Urinary Tract Infection Urinary tract infections (UTIs) can develop anywhere along your urinary tract. Your urinary tract is your body's drainage system for removing wastes and extra water. Your urinary tract includes two kidneys, two ureters, a bladder, and a urethra. Your kidneys are a pair of bean-shaped organs. Each kidney is about the size of your fist. They are located below your ribs, one on each side of your spine. CAUSES Infections are caused by microbes, which are microscopic organisms, including fungi, viruses, and bacteria. These organisms are so small that they can only be seen through a microscope. Bacteria are the microbes that most commonly cause UTIs. SYMPTOMS  Symptoms of UTIs may vary by age and gender of the patient and by the location of the infection. Symptoms in young women typically include a frequent and intense urge to urinate and a painful, burning feeling in the bladder or urethra during urination. Older women and men are more likely to be tired, shaky, and weak and have muscle aches and abdominal pain. A fever may mean the infection is in your kidneys. Other symptoms of a kidney infection include pain in your back or sides below the ribs, nausea, and vomiting. DIAGNOSIS To diagnose a UTI, your caregiver will ask you about your symptoms. Your caregiver will also ask you to provide a urine sample. The urine sample will be tested for bacteria and white blood cells. White blood cells are made by your body to help fight infection. TREATMENT  Typically, UTIs can be treated with medication. Because most UTIs are caused by a bacterial infection, they usually can be treated with the use of antibiotics. The choice of antibiotic and length of treatment depend on your symptoms and the type of bacteria causing  your infection. HOME CARE INSTRUCTIONS  If you were prescribed antibiotics, take them exactly as your caregiver instructs you. Finish the medication even if you feel better after you have only taken some of the medication.  Drink enough water and fluids to keep your urine clear or pale yellow.  Avoid caffeine, tea, and carbonated beverages. They tend to irritate your bladder.  Empty your bladder often. Avoid holding urine for long periods of time.  Empty your bladder before and after sexual intercourse.  After a bowel movement, women should cleanse from front to back. Use each tissue only once. SEEK MEDICAL CARE IF:   You have back pain.  You develop a fever.  Your symptoms do not begin to resolve within 3 days. SEEK IMMEDIATE MEDICAL CARE IF:   You have severe back pain or lower abdominal pain.  You develop chills.  You have nausea or vomiting.  You have continued burning or discomfort with urination. MAKE SURE YOU:   Understand these instructions.  Will watch your condition.  Will get help right away if you are not doing well or get worse.   This information is not intended to replace advice given to you by your health care provider. Make sure you discuss any questions you have with your health care provider.   Document Released: 05/09/2005 Document Revised: 04/20/2015 Document Reviewed: 09/07/2011 Elsevier Interactive Patient Education [employer]2016 Elsevier Inc.

## 2015-07-13 NOTE — ED Notes (Signed)
Registration at bedside.

## 2015-07-13 NOTE — ED Provider Notes (Signed)
CSN: [id number]     Arrival date & time 07/13/15  1417 History  By signing my name below, I, Carmen Wheeler, attest that this documentation has been prepared under the direction and in the presence of [employer], PA-C. Electronically Signed: [name], ED Scribe. 07/13/2015. 5:03 PM.  Chief Complaint  Patient presents with  . Urinary Frequency    The history is provided by the patient. No language interpreter was used.     HPI Comments: Carmen Wheeler is a 29 y.o. female who presents to the Emergency Department complaining of dysuria, frequency, urgency, mild hematuria, and suprapubic pain that began today. Patient reports all of her symptoms are typical of her normal UTI symptoms. [facility] She cannot say what her antibiotic were but reports that it relieved her symptoms until today. Denies vaginal bleeding, vaginal discharge, back pain, fever, chills, nausea, vomiting, or any other associated symptoms.   History reviewed. No pertinent past medical history. Past Surgical History  Procedure Laterality Date  . Cesarean section     No family history on file. Social History  Substance Use Topics  . Smoking status: Never Smoker   . Smokeless tobacco: None  . Alcohol Use: Yes   OB History    No data available     Review of Systems  Constitutional: Negative for fever and chills.  Gastrointestinal: Positive for abdominal pain. Negative for nausea and vomiting.  Genitourinary: Positive for dysuria, urgency, frequency and hematuria. Negative for vaginal bleeding and vaginal discharge.  Musculoskeletal: Negative for back pain.  All other systems reviewed and are negative.     Allergies  Review of patient's allergies indicates no known allergies.  Home Medications   Prior to Admission medications   Medication Sig Start Date End Date Taking? Authorizing Provider  acetaminophen (TYLENOL) 325 MG tablet Take 650 mg by  mouth 2 (two) times daily as needed for mild pain or headache.     Historical Provider, MD  ciprofloxacin (CIPRO) 500 MG tablet Take 1 tablet (500 mg total) by mouth 2 (two) times daily. 07/13/15   Joycie Peek, PA-C  meclizine (ANTIVERT) 25 MG tablet Take 1 tablet (25 mg total) by mouth 2 (two) times daily as needed for dizziness. Patient not taking: Reported on 03/27/2015 03/12/15   Blake Divine, MD  medroxyPROGESTERone (DEPO-PROVERA) 150 MG/ML injection Inject 150 mg into the muscle every 3 (three) months. Last injection July 2016    Historical Provider, MD  meloxicam (MOBIC) 7.5 MG tablet Take 1 tablet (7.5 mg total) by mouth daily. Patient not taking: Reported on 03/12/2015 06/22/14   Toni Amend Forcucci, PA-C  metroNIDAZOLE (FLAGYL) 500 MG tablet Take 1 tablet (500 mg total) by mouth 2 (two) times daily. 03/28/15   Garlon Hatchet, PA-C  traMADol (ULTRAM) 50 MG tablet Take 1 tablet (50 mg total) by mouth every 6 (six) hours as needed. Patient not taking: Reported on 03/12/2015 06/22/14   Terri Piedra, PA-C   Triage Vitals: BP 103/68 mmHg  Pulse 93  Temp(Src) 98.5 F (36.9 C) (Oral)  Resp 14  SpO2 99%   Physical Exam  Constitutional: She is oriented to person, place, and time. She appears well-developed and well-nourished. No distress.  HENT:  Head: Normocephalic and atraumatic.  Eyes: Conjunctivae and EOM are normal.  Neck: Neck supple. No tracheal deviation present.  Cardiovascular: Normal rate, regular rhythm and normal heart sounds.   Pulmonary/Chest: Effort normal and breath sounds normal.  No respiratory distress. She has no wheezes. She has no rhonchi. She has no rales.  Abdominal: Soft. She exhibits no distension and no mass. There is tenderness in the suprapubic area. There is no rebound, no guarding and no CVA tenderness.  Mild suprapubic tenderness  Musculoskeletal: Normal range of motion.  Neurological: She is alert and oriented to person, place, and time.  Skin: Skin  is warm and dry.  Psychiatric: She has a normal mood and affect. Her behavior is normal.  Nursing note and vitals reviewed.   ED Course  Procedures (including critical care time)  DIAGNOSTIC STUDIES: Oxygen Saturation is 99% on RA, normal by my interpretation.    COORDINATION OF CARE: 5:02 PM-Discussed treatment plan which includes rx antibiotics with pt at bedside and pt agreed to plan.   Labs Review Labs Reviewed  URINALYSIS, ROUTINE W REFLEX [facility]CROSCOPIC (NOT [facility]) - Abnormal; Notable for the following:    APPearance TURBID (*)    Hgb urine dipstick LARGE (*)    Protein, ur 100 (*)    Leukocytes, UA MODERATE (*)    All other components within normal limits  URINE MICROSCOPIC-ADD ON - Abnormal; Notable for the following:    Squamous Epithelial / LPF 0-5 (*)    Bacteria, UA MANY (*)    Crystals CA OXALATE CRYSTALS (*)    All other components within normal limits  URINE CULTURE  POC URINE PREG, ED   I have personally reviewed and evaluated these lab results as part of my medical decision-making.  Meds given in ED:  Medications - No data to display  Discharge Medication List as of 07/13/2015  5:12 PM    START taking these medications   Details  ciprofloxacin (CIPRO) 500 MG tablet Take 1 tablet (500 mg total) by mouth 2 (two) times daily., Starting 07/13/2015, Until Discontinued, Print       Filed Vitals:   07/13/15 1423 07/13/15 1731  BP: 103/68 104/67  Pulse: 93 84  Temp: 98.5 F (36.9 C) 98.7 F (37.1 C)  TempSrc: Oral Oral  Resp: 14 14  SpO2: 99% 98%    MDM  Pt diagnosed with a UTI. Pt is afebrile and vital signs are stable.  Mild tenderness over suprapubic region, states is consistent with her previous UTIs, otherwise benign abdominal exam. Urinalysis consistent with UTI. Pregnancy is negative. Pt to be dc home with antibiotics and instructions to follow up with PCP if symptoms persist. Obtain urine culture.  Discussed return precautions. Pt appears safe  for discharge. No evidence of other acute or emergent pathology at this time. Patient is hemodynamically stable, normal vital signs, afebrile.  Final diagnoses:  UTI (lower urinary tract infection)    I personally performed the services described in this documentation, which was scribed in my presence. The recorded information has been reviewed and is accurate.      Joycie PeekBenjamin Icie Kuznicki, PA-C 07/13/15 1831  Rolland PorterMark James, MD 07/27/15 [phone]

## 2015-07-13 NOTE — ED Notes (Signed)
Pt reports urinary burning, frequency, urgency and hematuria that started today. Was treated last week for UTI with antibiotic and s/s went away. Was also treated for yeast inf and trichomonas

## 2015-07-16 LAB — URINE CULTURE

## 2015-07-18 ENCOUNTER — Telehealth: Payer: Self-pay | Admitting: *Deleted

## 2015-07-18 NOTE — ED Notes (Signed)
(+)  urine culture, discharged on Ciprofloxacin, OK per Felicita GageJ. Geiple

## 2016-06-08 ENCOUNTER — Encounter (HOSPITAL_COMMUNITY): Payer: Self-pay | Admitting: Emergency Medicine

## 2016-06-08 ENCOUNTER — Emergency Department (HOSPITAL_COMMUNITY)
Admission: EM | Admit: 2016-06-08 | Discharge: 2016-06-08 | Disposition: A | Discharge status: Home / Self Care | Payer: BLUE CROSS/BLUE SHIELD | Attending: Emergency Medicine | Admitting: Emergency Medicine

## 2016-06-08 DIAGNOSIS — Z79899 Other long term (current) drug therapy: Secondary | ICD-10-CM | POA: Insufficient documentation

## 2016-06-08 DIAGNOSIS — N9489 Other specified conditions associated with female genital organs and menstrual cycle: Secondary | ICD-10-CM | POA: Insufficient documentation

## 2016-06-08 LAB — URINALYSIS, ROUTINE W REFLEX MICROSCOPIC
BILIRUBIN URINE: NEGATIVE
GLUCOSE, UA: NEGATIVE mg/dL
Hgb urine dipstick: NEGATIVE
Ketones, ur: NEGATIVE mg/dL
Leukocytes, UA: NEGATIVE
Nitrite: NEGATIVE
PROTEIN: NEGATIVE mg/dL
Specific Gravity, Urine: 1.027 (ref 1.005–1.030)
pH: 6.5 (ref 5.0–8.0)

## 2016-06-08 LAB — POC URINE PREG, ED: PREG TEST UR: NEGATIVE

## 2016-06-08 MED ORDER — OXYCODONE-ACETAMINOPHEN 5-325 MG PO TABS
1.0000 | ORAL_TABLET | Freq: Once | ORAL | Status: AC
  Administered 2016-06-08: 1 via ORAL
  Filled 2016-06-08: qty 1

## 2016-06-08 MED ORDER — ONDANSETRON 4 MG PO TBDP
4.0000 mg | ORAL_TABLET | Freq: Once | ORAL | Status: AC
  Administered 2016-06-08: 4 mg via ORAL
  Filled 2016-06-08: qty 1

## 2016-06-08 MED ORDER — TRAMADOL HCL 50 MG PO TABS: 50.0000 mg | ORAL_TABLET | Freq: Four times a day (QID) | ORAL | 0 refills | Status: DC | PRN

## 2016-06-08 MED ORDER — KETOROLAC TROMETHAMINE 60 MG/2ML IM SOLN
60.0000 mg | Freq: Once | INTRAMUSCULAR | Status: AC
  Administered 2016-06-08: 60 mg via INTRAMUSCULAR
  Filled 2016-06-08: qty 2

## 2016-06-08 NOTE — ED Notes (Signed)
No respiratory or acute distress noted alert and oriented x 3 call light in reach. 

## 2016-06-08 NOTE — ED Triage Notes (Signed)
Pt is c/o lower abd/pubic area cramping  Pt states the cramps started on Monday and has progressively gotten worse  Pt states she took tylenol at 2300 without relief  Pt states she was given provera by her dr and she completed it as prescribed and was supposed to start her period on Saturday but did not  Pt state she called her dr and is to go in on Nov 6th but the cramping got so bad she could not wait  Pt denies any vaginal spotting or bleeding

## 2016-06-08 NOTE — ED Notes (Signed)
No respiratory or acute distress noted alert and oriented x 3 no reaction to medication noted visitor at bedside call light in reach. 

## 2016-06-08 NOTE — ED Provider Notes (Signed)
[facility]-EMERGENCY DEPT Provider Note   CSN: [id number] Arrival date & time: 06/08/16  0214     History   Chief Complaint Chief Complaint  Patient presents with  . Abdominal Pain    HPI Alda LeaFlor Donaghey is a 30 y.o. female.  HPI   Pt to the ER for pain control. She has not had a menstrual cycle in 10 years and was therefore started on hormone therapy to cause her periods to start again. She has not had a period because [facility]. She was supposed to start bleeding within two weeks but it did not work, therefore she has a follow-up appointment scheduled for Nov 6. This evening however she developed bilateral breast tenderness and suprapubic cramping that she describes as severe. She has not had any bleeding in her urethra or vagina. She has not had fever, nausea, vomiting or diarrhea.  History reviewed. No pertinent past medical history.  There are no active problems to display for this patient.   Past Surgical History:  Procedure Laterality Date  . CESAREAN SECTION      OB History    No data available       Home Medications    Prior to Admission medications   Medication Sig Start Date End Date Taking? Authorizing Provider  acetaminophen (TYLENOL) 325 MG tablet Take 650 mg by mouth 2 (two) times daily as needed for mild pain or headache.    Yes Historical Provider, MD  Prenatal Vit-Fe Fumarate-FA (PRENATAL MULTIVITAMIN) TABS tablet Take 1 tablet by mouth daily at 12 noon.   Yes Historical Provider, MD  ciprofloxacin (CIPRO) 500 MG tablet Take 1 tablet (500 mg total) by mouth 2 (two) times daily. Patient not taking: Reported on 06/08/2016 07/13/15   Joycie PeekBenjamin Cartner, PA-C  meclizine (ANTIVERT) 25 MG tablet Take 1 tablet (25 mg total) by mouth 2 (two) times daily as needed for dizziness. Patient not taking: Reported on 03/27/2015 03/12/15   Blake DivineJohn Wofford, MD  medroxyPROGESTERone (PROVERA) 5 MG tablet Take 10 mg by mouth daily.    Historical Provider, MD  meloxicam  (MOBIC) 7.5 MG tablet Take 1 tablet (7.5 mg total) by mouth daily. Patient not taking: Reported on 03/12/2015 06/22/14   Toni Amendourtney Forcucci, PA-C  metroNIDAZOLE (FLAGYL) 500 MG tablet Take 1 tablet (500 mg total) by mouth 2 (two) times daily. Patient not taking: Reported on 06/08/2016 03/28/15   Garlon HatchetLisa M Sanders, PA-C  traMADol (ULTRAM) 50 MG tablet Take 1 tablet (50 mg total) by mouth every 6 (six) hours as needed. Patient not taking: Reported on 03/12/2015 06/22/14   Terri Piedraourtney Forcucci, PA-C    Family History History reviewed. No pertinent family history.  Social History Social History  Substance Use Topics  . Smoking status: Never Smoker  . Smokeless tobacco: Never Used  . Alcohol use No     Allergies   Review of patient's allergies indicates no known allergies.   Review of Systems Review of Systems  Review of Systems All other systems negative except as documented in the HPI. All pertinent positives and negatives as reviewed in the HPI.  Physical Exam Updated Vital Signs BP 128/74 (BP Location: Right Arm)   Pulse 93   Temp 98.2 F (36.8 C) (Oral)   Resp 20   Wt 56.2 kg   SpO2 99%   BMI 25.04 kg/m   Physical Exam  Constitutional: She appears well-developed and well-nourished. No distress.  HENT:  Head: Normocephalic and atraumatic.  Eyes: Pupils are equal, round, and  reactive to light.  Neck: Normal range of motion. Neck supple.  Cardiovascular: Normal rate and regular rhythm.   Pulmonary/Chest: Effort normal.  Abdominal: Soft. There is tenderness in the suprapubic area. There is no rigidity, no rebound and no guarding.  Neurological: She is alert.  Skin: Skin is warm and dry.  Nursing note and vitals reviewed.    ED Treatments / Results  Labs (all labs ordered are listed, but only abnormal results are displayed) Labs Reviewed  URINALYSIS, ROUTINE W REFLEX MICROSCOPIC (NOT AT [facility]) - Abnormal; Notable for the following:       Result Value   APPearance CLOUDY  (*)    All other components within normal limits  POC URINE PREG, ED    EKG  EKG Interpretation None       Radiology No results found.  Procedures Procedures (including critical care time)  Medications Ordered in ED Medications  ketorolac (TORADOL) injection 60 mg (60 mg Intramuscular Given 06/08/16 0413)  oxyCODONE-acetaminophen (PERCOCET/ROXICET) 5-325 MG per tablet 1 tablet (1 tablet Oral Given 06/08/16 0413)  ondansetron (ZOFRAN-ODT) disintegrating tablet 4 mg (4 mg Oral Given 06/08/16 0413)     Initial Impression / Assessment and Plan / ED Course  I have reviewed the triage vital signs and the nursing notes.  Pertinent labs & imaging results that were available during my care of the patient were reviewed by me and considered in my medical decision making (see chart for details).  Clinical Course    Provided pain control in the ED for cramping and breast pain. Her urine pregnancy test is negative. She does not have UTI. Will prescribe medication for her to help treat cramps. She has no systemic symptoms and normal vital signs, no back pain, vaginal discharge or bleeding, no diarrhea, RLQ pain, N/V/D. She is most likely going to start a menstrual cycle soon. Discussed strict return precautions and that she call her Gynecologist to make them aware of the severe cramping.  Final Clinical Impressions(s) / ED Diagnoses   Final diagnoses:  Uterine cramping    New Prescriptions New Prescriptions   No medications on file     Marlon Pel, Cordelia Poche 06/08/16 0423    Zadie Rhine, MD 06/08/16 [phone]

## 2017-01-09 ENCOUNTER — Emergency Department (HOSPITAL_COMMUNITY)
Admission: EM | Admit: 2017-01-09 | Discharge: 2017-01-09 | Disposition: A | Discharge status: Home / Self Care | Payer: BLUE CROSS/BLUE SHIELD | Attending: Emergency Medicine | Admitting: Emergency Medicine

## 2017-01-09 ENCOUNTER — Encounter (HOSPITAL_COMMUNITY): Payer: Self-pay | Admitting: Emergency Medicine

## 2017-01-09 DIAGNOSIS — Y999 Unspecified external cause status: Secondary | ICD-10-CM | POA: Insufficient documentation

## 2017-01-09 DIAGNOSIS — W57XXXA Bitten or stung by nonvenomous insect and other nonvenomous arthropods, initial encounter: Secondary | ICD-10-CM | POA: Insufficient documentation

## 2017-01-09 DIAGNOSIS — Z79899 Other long term (current) drug therapy: Secondary | ICD-10-CM | POA: Insufficient documentation

## 2017-01-09 DIAGNOSIS — S70361A Insect bite (nonvenomous), right thigh, initial encounter: Secondary | ICD-10-CM | POA: Insufficient documentation

## 2017-01-09 DIAGNOSIS — Y929 Unspecified place or not applicable: Secondary | ICD-10-CM | POA: Insufficient documentation

## 2017-01-09 DIAGNOSIS — Y939 Activity, unspecified: Secondary | ICD-10-CM | POA: Insufficient documentation

## 2017-01-09 MED ORDER — TRIAMCINOLONE ACETONIDE 0.1 % EX CREA: 1.0000 "application " | TOPICAL_CREAM | Freq: Two times a day (BID) | CUTANEOUS | 0 refills | Status: DC

## 2017-01-09 NOTE — ED Provider Notes (Signed)
[facility]-EMERGENCY DEPT Provider Note   CSN: [id number] Arrival date & time: 01/09/17  0810     History   Chief Complaint Chief Complaint  Patient presents with  . Insect Bite    HPI Carmen Wheeler is a 31 y.o. female.  HPI   Carmen Wheeler is a 31 y.o. female, Patient with no pertinent past medical history, presenting to the ED with an area of erythema and itching to the right inner thigh for the last several days. Patient states she thinks this stems from an insect bite. She has several other bites to her legs that she states also itch. The area in question she states is now more red than the others. She has not tried any therapies for this issue. She denies pain to the area, fever, swelling, or any other complaints.    History reviewed. No pertinent past medical history.  There are no active problems to display for this patient.   Past Surgical History:  Procedure Laterality Date  . CESAREAN SECTION      OB History    No data available       Home Medications    Prior to Admission medications   Medication Sig Start Date End Date Taking? Authorizing Provider  acetaminophen (TYLENOL) 325 MG tablet Take 650 mg by mouth 2 (two) times daily as needed for mild pain or headache.     [provider]  ciprofloxacin (CIPRO) 500 MG tablet Take 1 tablet (500 mg total) by mouth 2 (two) times daily. Patient not taking: Reported on 06/08/2016 07/13/15   Joycie Peek, PA-C  meclizine (ANTIVERT) 25 MG tablet Take 1 tablet (25 mg total) by mouth 2 (two) times daily as needed for dizziness. Patient not taking: Reported on 03/27/2015 03/12/15   Blake Divine, MD  medroxyPROGESTERone (PROVERA) 5 MG tablet Take 10 mg by mouth daily.    [provider]  meloxicam (MOBIC) 7.5 MG tablet Take 1 tablet (7.5 mg total) by mouth daily. Patient not taking: Reported on 03/12/2015 06/22/14   Forcucci, Toni Amend, PA-C  metroNIDAZOLE (FLAGYL) 500 MG tablet Take 1 tablet  (500 mg total) by mouth 2 (two) times daily. Patient not taking: Reported on 06/08/2016 03/28/15   Garlon Hatchet, PA-C  Prenatal Vit-Fe Fumarate-FA (PRENATAL MULTIVITAMIN) TABS tablet Take 1 tablet by mouth daily at 12 noon.    [provider]  traMADol (ULTRAM) 50 MG tablet Take 1 tablet (50 mg total) by mouth every 6 (six) hours as needed. Patient not taking: Reported on 03/12/2015 06/22/14   Forcucci, Toni Amend, PA-C  traMADol (ULTRAM) 50 MG tablet Take 1 tablet (50 mg total) by mouth every 6 (six) hours as needed. 06/08/16   Marlon Pel, PA-C  triamcinolone cream (KENALOG) 0.1 % Apply 1 application topically 2 (two) times daily. 01/09/17   Anselm Pancoast, PA-C    Family History History reviewed. No pertinent family history.  Social History Social History  Substance Use Topics  . Smoking status: Never Smoker  . Smokeless tobacco: Never Used  . Alcohol use No     Allergies   Patient has no known allergies.   Review of Systems Review of Systems  Constitutional: Negative for fever.  Skin: Positive for color change. Negative for wound.     Physical Exam Updated Vital Signs BP 110/71 (BP Location: Left Arm)   Pulse 84   Temp 98.3 F (36.8 C) (Oral)   Resp 16   SpO2 99%   Physical Exam  Constitutional: She appears  well-developed and well-nourished. No distress.  HENT:  Head: Normocephalic and atraumatic.  Eyes: Conjunctivae are normal.  Neck: Neck supple.  Cardiovascular: Normal rate and regular rhythm.   Pulmonary/Chest: Effort normal.  Neurological: She is alert.  Skin: Skin is warm and dry. She is not diaphoretic. There is erythema.     Small, circular area of erythema to the right upper thigh measuring approximately 3 cm in diameter. There is no swelling, fluctuance, induration, or tenderness. No open wound noted. No drainage from the area noted.  Psychiatric: She has a normal mood and affect. Her behavior is normal.  Nursing note and vitals  reviewed.    ED Treatments / Results  Labs (all labs ordered are listed, but only abnormal results are displayed) Labs Reviewed - No data to display  EKG  EKG Interpretation None       Radiology No results found.  Procedures Procedures (including critical care time)  Medications Ordered in ED Medications - No data to display   Initial Impression / Assessment and Plan / ED Course  I have reviewed the triage vital signs and the nursing notes.  Pertinent labs & imaging results that were available during my care of the patient were reviewed by me and considered in my medical decision making (see chart for details).     Patient presents with an area of redness and itching secondary to possible insect bite. The area does not appear to be cellulitic at this time. Trial of steroid cream. The patient was given instructions for home care as well as strict return precautions. Patient voices understanding of these instructions, accepts the plan, and is comfortable with discharge.    Final Clinical Impressions(s) / ED Diagnoses   Final diagnoses:  Insect bite, initial encounter    New Prescriptions Discharge Medication List as of 01/09/2017  9:17 AM    START taking these medications   Details  triamcinolone cream (KENALOG) 0.1 % Apply 1 application topically 2 (two) times daily., Starting Wed 01/09/2017, Print         Kayen Grabel C, PA-C 01/09/17 [id number]    Pricilla LovelessGoldston, Scott, MD 01/16/17 [phone]

## 2017-01-09 NOTE — ED Triage Notes (Signed)
Pt sts insect bite to right leg that pt scratched and is now red and warm

## 2017-01-09 NOTE — Discharge Instructions (Signed)
Apply the triamcinolone cream twice a day until area improves. Use this medication for no more than two weeks at a time. Follow up with a primary care provider as needed for continued management of this issue. Return to the ED for worsening symptoms.

## 2017-06-18 ENCOUNTER — Other Ambulatory Visit: Payer: Self-pay

## 2017-06-18 ENCOUNTER — Emergency Department (HOSPITAL_COMMUNITY)
Admission: EM | Admit: 2017-06-18 | Discharge: 2017-06-18 | Disposition: A | Discharge status: Home / Self Care | Payer: BLUE CROSS/BLUE SHIELD | Attending: Emergency Medicine | Admitting: Emergency Medicine

## 2017-06-18 DIAGNOSIS — J029 Acute pharyngitis, unspecified: Secondary | ICD-10-CM | POA: Insufficient documentation

## 2017-06-18 DIAGNOSIS — B9789 Other viral agents as the cause of diseases classified elsewhere: Secondary | ICD-10-CM | POA: Insufficient documentation

## 2017-06-18 DIAGNOSIS — Z79899 Other long term (current) drug therapy: Secondary | ICD-10-CM | POA: Insufficient documentation

## 2017-06-18 DIAGNOSIS — J069 Acute upper respiratory infection, unspecified: Secondary | ICD-10-CM

## 2017-06-18 LAB — RAPID STREP SCREEN (MED CTR MEBANE ONLY): Streptococcus, Group A Screen (Direct): NEGATIVE

## 2017-06-18 NOTE — ED Provider Notes (Signed)
[facility]   CSN: [id number] Arrival date & time: 06/18/17  0801     History   Chief Complaint Chief Complaint  Patient presents with  . Sore Throat    HPI Carmen Wheeler is a 31 y.o. female presents to ED for evaluation of nasal congestion, rhinorrhea, and scratchy sore throat sx 2-3 days. She has a dry repeated cough to "clear my throat" but no productive cough. No fevers, chills, CP, SOB, vomiting, diarrhea and abdominal pain.   HPI  No past medical history on file.  There are no active problems to display for this patient.   Past Surgical History:  Procedure Laterality Date  . CESAREAN SECTION      OB History    No data available       Home Medications    Prior to Admission medications   Medication Sig Start Date End Date Taking? Authorizing Provider  acetaminophen (TYLENOL) 325 MG tablet Take 650 mg by mouth 2 (two) times daily as needed for mild pain or headache.     [provider]  ciprofloxacin (CIPRO) 500 MG tablet Take 1 tablet (500 mg total) by mouth 2 (two) times daily. Patient not taking: Reported on 06/08/2016 07/13/15   Joycie Peekartner, Benjamin, PA-C  meclizine (ANTIVERT) 25 MG tablet Take 1 tablet (25 mg total) by mouth 2 (two) times daily as needed for dizziness. Patient not taking: Reported on 03/27/2015 03/12/15   Blake DivineWofford, John, MD  medroxyPROGESTERone (PROVERA) 5 MG tablet Take 10 mg by mouth daily.    [provider]  meloxicam (MOBIC) 7.5 MG tablet Take 1 tablet (7.5 mg total) by mouth daily. Patient not taking: Reported on 03/12/2015 06/22/14   Forcucci, Toni Amendourtney, PA-C  metroNIDAZOLE (FLAGYL) 500 MG tablet Take 1 tablet (500 mg total) by mouth 2 (two) times daily. Patient not taking: Reported on 06/08/2016 03/28/15   Garlon HatchetSanders, Lisa M, PA-C  Prenatal Vit-Fe Fumarate-FA (PRENATAL MULTIVITAMIN) TABS tablet Take 1 tablet by mouth daily at 12 noon.    [provider]    traMADol (ULTRAM) 50 MG tablet Take 1 tablet (50 mg total) by mouth every 6 (six) hours as needed. Patient not taking: Reported on 03/12/2015 06/22/14   Forcucci, Toni Amendourtney, PA-C  traMADol (ULTRAM) 50 MG tablet Take 1 tablet (50 mg total) by mouth every 6 (six) hours as needed. 06/08/16   [name], Tiffany, PA-C  triamcinolone cream (KENALOG) 0.1 % Apply 1 application topically 2 (two) times daily. 01/09/17   Anselm PancoastJoy, [name], PA-C    Family History No family history on file.  Social History Social History   Tobacco Use  . Smoking status: Never Smoker  . Smokeless tobacco: Never Used  Substance Use Topics  . Alcohol use: No  . Drug use: No     Allergies   Patient has no known allergies.   Review of Systems Review of Systems  Constitutional: Negative for fever.  HENT: Positive for congestion, postnasal drip and rhinorrhea. Negative for sore throat.   Respiratory: Positive for cough. Negative for shortness of breath.   Cardiovascular: Negative for chest pain.     Physical Exam Updated Vital Signs BP (!) 101/59 (BP Location: Right Arm)   Pulse 81   Temp 99 F (37.2 C) (Oral)   Resp 16   Ht 4\' 11"  (1.499 m)   Wt 53.1 kg (117 lb)   SpO2 99%   BMI 23.63 kg/m   Physical Exam  Constitutional: She is oriented  to person, place, and time. She appears well-developed and well-nourished. No distress.  NAD.  HENT:  Head: Normocephalic and atraumatic.  Right Ear: External ear normal.  Left Ear: External ear normal.  Nose: Mucosal edema present. No rhinorrhea. Right sinus exhibits no maxillary sinus tenderness and no frontal sinus tenderness. Left sinus exhibits no maxillary sinus tenderness and no frontal sinus tenderness.  Mouth/Throat: Mucous membranes are normal. Posterior oropharyngeal erythema present. No posterior oropharyngeal edema. Tonsils are 1+ on the right. Tonsils are 1+ on the left.  No significant facial or anterior neck edema or tenderness Oropharynx and tonsils  edema, abscess, asymmetry, exudates or petechiae  No sublingual edema or tenderness.  Soft palate flat without tenderness.  No trismus.   No pooling of oral secretions.  Phonation normal, no hot potato voice.  Maxilla and mandible nontender. Mastoids without edema, erythema or tenderness.    Eyes: Conjunctivae and EOM are normal. Pupils are equal, round, and reactive to light. No scleral icterus.  Neck: Normal range of motion. Neck supple.  Cardiovascular: Normal rate, regular rhythm and normal heart sounds.  No murmur heard. Pulmonary/Chest: Effort normal and breath sounds normal. No stridor. No tachypnea. She has no decreased breath sounds. She has no wheezes.  Musculoskeletal: Normal range of motion. She exhibits no deformity.  Neurological: She is alert and oriented to person, place, and time.  Skin: Skin is warm and dry. Capillary refill takes less than 2 seconds.  Psychiatric: She has a normal mood and affect. Her behavior is normal. Judgment and thought content normal.  Nursing note and vitals reviewed.    ED Treatments / Results  Labs (all labs ordered are listed, but only abnormal results are displayed) Labs Reviewed  RAPID STREP SCREEN (NOT [facility])  CULTURE, GROUP A STREP [facility])    EKG  EKG Interpretation None       Radiology No results found.  Procedures Procedures (including critical care time)  Medications Ordered in ED Medications - No data to display   Initial Impression / Assessment and Plan / ED Course  I have reviewed the triage vital signs and the nursing notes.  Pertinent labs & imaging results that were available during my care of the patient were reviewed by me and considered in my medical decision making (see chart for details).    31 year old female presents with upper respiratory infection/pharyngitis symptoms. No fevers, chills, productive cough, chest pain, shortness of breath. Exam is very benign. Mild oropharyngeal erythema but no  edema. Soft palate is flat. Exam is not concerning for deep neck tissue infection or Ludwig's.Strep is negative. We'll discharge with symptomatic management. Discussed specific symptoms that would warrant prompt return to ED.  Final Clinical Impressions(s) / ED Diagnoses   Final diagnoses:  Viral pharyngitis  Viral upper respiratory tract infection    ED Discharge Orders    None       Liberty HandyGibbons, Tiye Huwe J, PA-C 06/18/17 1202    Melene PlanFloyd, Dan, DO 06/18/17 1430

## 2017-06-18 NOTE — ED Triage Notes (Signed)
Pt reports sore throat with chills and headache. Pt does have red inflammed tonsils.

## 2017-06-18 NOTE — Discharge Instructions (Signed)
I suspect you have a viral infection of your upper airways and pharynx.   Drink plenty of fluids. Use flonase nasal spray to help with nasal congestion and post nasal drip. Take ibuprofen 600 mg and 500 mg tylenol every 8 hours for sore throat. Honey and warm water/tea also have anti-inflammatory properties.   Follow up with your primary care provider if you develop worsening symptoms, fevers, difficulty breathing,,neck swelling, drooling, changes in your voice, difficulty opening your jaw

## 2017-06-18 NOTE — ED Notes (Signed)
Pt reports sore throat X3 days with fever and chills. Denies SOB or cough.

## 2017-06-20 LAB — CULTURE, GROUP A STREP (THRC)

## 2017-08-25 ENCOUNTER — Other Ambulatory Visit: Payer: Self-pay

## 2017-08-25 ENCOUNTER — Encounter (HOSPITAL_COMMUNITY): Payer: Self-pay | Admitting: Emergency Medicine

## 2017-08-25 ENCOUNTER — Emergency Department (HOSPITAL_COMMUNITY): Payer: Self-pay

## 2017-08-25 ENCOUNTER — Emergency Department (HOSPITAL_COMMUNITY)
Admission: EM | Admit: 2017-08-25 | Discharge: 2017-08-25 | Disposition: A | Discharge status: Home / Self Care | Payer: Self-pay | Attending: Emergency Medicine | Admitting: Emergency Medicine

## 2017-08-25 DIAGNOSIS — R1031 Right lower quadrant pain: Secondary | ICD-10-CM | POA: Insufficient documentation

## 2017-08-25 DIAGNOSIS — Z79899 Other long term (current) drug therapy: Secondary | ICD-10-CM | POA: Insufficient documentation

## 2017-08-25 LAB — CBC WITH DIFFERENTIAL/PLATELET
BASOS ABS: 0 10*3/uL (ref 0.0–0.1)
Basophils Relative: 1 %
EOS ABS: 0.1 10*3/uL (ref 0.0–0.7)
Eosinophils Relative: 1 %
HCT: 38 % (ref 36.0–46.0)
HEMOGLOBIN: 12.7 g/dL (ref 12.0–15.0)
LYMPHS PCT: 30 %
Lymphs Abs: 2 10*3/uL (ref 0.7–4.0)
MCH: 29.3 pg (ref 26.0–34.0)
MCHC: 33.4 g/dL (ref 30.0–36.0)
MCV: 87.8 fL (ref 78.0–100.0)
Monocytes Absolute: 0.4 10*3/uL (ref 0.1–1.0)
Monocytes Relative: 6 %
NEUTROS PCT: 62 %
Neutro Abs: 4.1 10*3/uL (ref 1.7–7.7)
PLATELETS: 173 10*3/uL (ref 150–400)
RBC: 4.33 MIL/uL (ref 3.87–5.11)
RDW: 13 % (ref 11.5–15.5)
WBC: 6.6 10*3/uL (ref 4.0–10.5)

## 2017-08-25 LAB — COMPREHENSIVE METABOLIC PANEL
ALK PHOS: 48 U/L (ref 38–126)
ALT: 17 U/L (ref 14–54)
AST: 20 U/L (ref 15–41)
Albumin: 3.5 g/dL (ref 3.5–5.0)
Anion gap: 8 (ref 5–15)
BUN: 7 mg/dL (ref 6–20)
CHLORIDE: 106 mmol/L (ref 101–111)
CO2: 25 mmol/L (ref 22–32)
CREATININE: 0.5 mg/dL (ref 0.44–1.00)
Calcium: 8.8 mg/dL — ABNORMAL LOW (ref 8.9–10.3)
GFR calc Af Amer: >60 mL/min (ref >60)
GFR calc non Af Amer: >60 mL/min (ref >60)
Glucose, Bld: 94 mg/dL (ref 65–99)
Potassium: 3.4 mmol/L — ABNORMAL LOW (ref 3.5–5.1)
SODIUM: 139 mmol/L (ref 135–145)
Total Bilirubin: 0.4 mg/dL (ref 0.3–1.2)
Total Protein: 5.7 g/dL — ABNORMAL LOW (ref 6.5–8.1)

## 2017-08-25 LAB — URINALYSIS, ROUTINE W REFLEX MICROSCOPIC
Bilirubin Urine: NEGATIVE
GLUCOSE, UA: NEGATIVE mg/dL
HGB URINE DIPSTICK: NEGATIVE
Ketones, ur: NEGATIVE mg/dL
Leukocytes, UA: NEGATIVE
Nitrite: NEGATIVE
PH: 7 (ref 5.0–8.0)
Protein, ur: NEGATIVE mg/dL
SPECIFIC GRAVITY, URINE: 1.005 (ref 1.005–1.030)

## 2017-08-25 LAB — LIPASE, BLOOD: Lipase: 41 U/L (ref 11–51)

## 2017-08-25 LAB — POC URINE PREG, ED: Preg Test, Ur: NEGATIVE

## 2017-08-25 MED ORDER — IOPAMIDOL (ISOVUE-300) INJECTION 61%
INTRAVENOUS | Status: AC
  Administered 2017-08-25: 100 mL via INTRAVENOUS
  Filled 2017-08-25: qty 100

## 2017-08-25 MED ORDER — ACETAMINOPHEN 500 MG PO TABS
1000.0000 mg | ORAL_TABLET | Freq: Once | ORAL | Status: AC
  Administered 2017-08-25: 1000 mg via ORAL
  Filled 2017-08-25: qty 2

## 2017-08-25 MED ORDER — SODIUM CHLORIDE 0.9 % IV BOLUS (SEPSIS)
1000.0000 mL | Freq: Once | INTRAVENOUS | Status: AC
  Administered 2017-08-25: 1000 mL via INTRAVENOUS

## 2017-08-25 NOTE — Discharge Instructions (Signed)
Workup today has been very reassuring, labs look good and CT scans shows no evidence of acute abdominal or pelvic problems.  Can continue to take ibuprofen and Tylenol for pain.  Be sure to drink plenty of fluids.  [facility]  Given that this pain is very much associated with movement, it could be musculoskeletal.  If you have severely worsening abdominal pain develop fevers, nausea, vomiting, vaginal discharge or bleeding please return to the emergency department otherwise follow-up outpatient as we discussed.

## 2017-08-25 NOTE — ED Triage Notes (Signed)
PT states 2 days of RLQ abdominal pain. Normal bowel movements, no nausea, no vaginal symptoms, no urinary symptoms. Tender to palpation. Worse with "stretching." No recent exercise or injury.

## 2017-08-25 NOTE — ED Notes (Signed)
Patient transported to CT 

## 2017-08-25 NOTE — ED Provider Notes (Signed)
[facility] EMERGENCY DEPARTMENT Provider Note   CSN: [id number] Arrival date & time: 08/25/17  1219     History   Chief Complaint Chief Complaint  Patient presents with  . Abdominal Pain    HPI  Carmen Wheeler is a 32 y.o. Female is otherwise healthy, presents complaining of 4 days of worsening right lower quadrant pain.  Patient reports pain started out as dull minimal pain, and over the past 2 days pain has become increasingly worse, she currently rates her pain a 6 out of 10.  Abdominal pain elsewhere. Patient describes the pain as sharp and constant in nature.  Has tried ibuprofen with no improvement, denies any other aggravating or alleviating factors.  Patient reports associated decreased appetite.  Patient denies any associated fevers, nausea, vomiting or diarrhea.  No bloody stools.  She denies any burning with urination, frequency or hematuria, no flank pain.  Patient reports she is currently sexually active, and is trying to get pregnant, denies any sexual partners other than her husband.  Denies any vaginal discharge or pelvic discomfort.  Patient unsure if she is pregnant at this time.  Patient reports history of 3 previous C-sections, no other abdominal surgeries.  Patient also reports she donated plasma twice this week.      History reviewed. No pertinent past medical history.  There are no active problems to display for this patient.   Past Surgical History:  Procedure Laterality Date  . CESAREAN SECTION      OB History    No data available       Home Medications    Prior to Admission medications   Medication Sig Start Date End Date Taking? Authorizing Provider  acetaminophen (TYLENOL) 325 MG tablet Take 650 mg by mouth 2 (two) times daily as needed for mild pain or headache.     [provider]  ciprofloxacin (CIPRO) 500 MG tablet Take 1 tablet (500 mg total) by mouth 2 (two) times daily. Patient not taking: Reported on  06/08/2016 07/13/15   Joycie Peek, PA-C  meclizine (ANTIVERT) 25 MG tablet Take 1 tablet (25 mg total) by mouth 2 (two) times daily as needed for dizziness. Patient not taking: Reported on 03/27/2015 03/12/15   Blake Divine, MD  medroxyPROGESTERone (PROVERA) 5 MG tablet Take 10 mg by mouth daily.    [provider]  meloxicam (MOBIC) 7.5 MG tablet Take 1 tablet (7.5 mg total) by mouth daily. Patient not taking: Reported on 03/12/2015 06/22/14   Forcucci, Toni Amend, PA-C  metroNIDAZOLE (FLAGYL) 500 MG tablet Take 1 tablet (500 mg total) by mouth 2 (two) times daily. Patient not taking: Reported on 06/08/2016 03/28/15   Garlon Hatchet, PA-C  Prenatal Vit-Fe Fumarate-FA (PRENATAL MULTIVITAMIN) TABS tablet Take 1 tablet by mouth daily at 12 noon.    [provider]  traMADol (ULTRAM) 50 MG tablet Take 1 tablet (50 mg total) by mouth every 6 (six) hours as needed. Patient not taking: Reported on 03/12/2015 06/22/14   Forcucci, Toni Amend, PA-C  traMADol (ULTRAM) 50 MG tablet Take 1 tablet (50 mg total) by mouth every 6 (six) hours as needed. 06/08/16   Marlon Pel, PA-C  triamcinolone cream (KENALOG) 0.1 % Apply 1 application topically 2 (two) times daily. 01/09/17   Anselm Pancoast, PA-C    Family History No family history on file.  Social History Social History   Tobacco Use  . Smoking status: Never Smoker  . Smokeless tobacco: Never Used  Substance Use Topics  .  Alcohol use: No  . Drug use: No     Allergies   Patient has no known allergies.   Review of Systems Review of Systems  Constitutional: Positive for appetite change. Negative for chills and fever.  HENT: Negative for congestion, rhinorrhea and sore throat.   Eyes: Negative for photophobia, discharge and itching.  Respiratory: Negative for cough and shortness of breath.   Cardiovascular: Negative for chest pain.  Gastrointestinal: Positive for abdominal pain. Negative for blood in stool, diarrhea, nausea  and vomiting.  Genitourinary: Negative for dysuria, flank pain, frequency, pelvic pain, vaginal bleeding, vaginal discharge and vaginal pain.  Musculoskeletal: Negative for back pain.  Skin: Negative for rash.  Neurological: Negative for headaches.     Physical Exam Updated Vital Signs BP (!) 100/56 (BP Location: Right Arm)   Pulse (!) 114   Temp 98.5 F (36.9 C) (Oral)   Resp 17   Ht 5' (1.524 m)   Wt 51.7 kg (114 lb)   LMP 08/13/2017   SpO2 100%   BMI 22.26 kg/m   Physical Exam  Constitutional: She appears well-developed and well-nourished. No distress.  HENT:  Head: Normocephalic and atraumatic.  Mouth/Throat: Oropharynx is clear and moist.  Eyes: Right eye exhibits no discharge. Left eye exhibits no discharge.  Cardiovascular: Normal rate, regular rhythm, normal heart sounds and intact distal pulses.  Pulmonary/Chest: Effort normal and breath sounds normal. No stridor. No respiratory distress. She has no wheezes. She has no rhonchi. She has no rales.  Abdominal: Soft. Normal appearance and bowel sounds are normal.  Patient is focally tender in the right lower quadrant with guarding, mild suprapubic tenderness, abdomen otherwise nontender to palpation, positive tenderness at McBurney's point, negative Murphy sign, no CVA tenderness  Neurological: She is alert. Coordination normal.  Skin: Skin is warm and dry. Capillary refill takes less than 2 seconds. She is not diaphoretic.  Psychiatric: She has a normal mood and affect. Her behavior is normal.  Nursing note and vitals reviewed.    ED Treatments / Results  Labs (all labs ordered are listed, but only abnormal results are displayed) Labs Reviewed  COMPREHENSIVE METABOLIC PANEL - Abnormal; Notable for the following components:      Result Value   Potassium 3.4 (*)    Calcium 8.8 (*)    Total Protein 5.7 (*)    All other components within normal limits  URINALYSIS, ROUTINE W REFLEX MICROSCOPIC - Abnormal; Notable for  the following components:   Color, Urine STRAW (*)    All other components within normal limits  CBC WITH DIFFERENTIAL/PLATELET  LIPASE, BLOOD  POC URINE PREG, ED    EKG  EKG Interpretation None       Radiology Ct Abdomen Pelvis W Contrast  Result Date: 08/25/2017 CLINICAL DATA:  Right lower quadrant pain. EXAM: CT ABDOMEN AND PELVIS WITH CONTRAST TECHNIQUE: Multidetector CT imaging of the abdomen and pelvis was performed using the standard protocol following bolus administration of intravenous contrast. CONTRAST:  ISOVUE-300 IOPAMIDOL (ISOVUE-300) INJECTION 61% COMPARISON:  None. FINDINGS: Lower chest: Lung bases are clear. No effusions. Heart is normal size. Hepatobiliary: No focal hepatic abnormality. Gallbladder unremarkable. Pancreas: No focal abnormality or ductal dilatation. Spleen: No focal abnormality.  Normal size. Adrenals/Urinary Tract: No adrenal abnormality. No focal renal abnormality. No stones or hydronephrosis. Urinary bladder is unremarkable. Stomach/Bowel: Normal appendix. Stomach, large and small bowel grossly unremarkable. Vascular/Lymphatic: No evidence of aneurysm or adenopathy. Reproductive: Uterus and adnexa unremarkable.  No mass. Other: No free  fluid or free air. Musculoskeletal: No acute bony abnormality. IMPRESSION: No acute findings in the abdomen or pelvis. Normal appendix. Electronically Signed   By: Charlett NoseKevin  Dover M.D.   On: 08/25/2017 14:41    Procedures Procedures (including critical care time)  Medications Ordered in ED Medications  sodium chloride 0.9 % bolus 1,000 mL (0 mLs Intravenous Stopped 08/25/17 1519)  iopamidol (ISOVUE-300) 61 % injection (100 mLs Intravenous Contrast Given 08/25/17 1410)  acetaminophen (TYLENOL) tablet 1,000 mg (1,000 mg Oral Given 08/25/17 1450)     Initial Impression / Assessment and Plan / ED Course  I have reviewed the triage vital signs and the nursing notes.  Pertinent labs & imaging results that were available  during my care of the patient were reviewed by me and considered in my medical decision making (see chart for details).  Patient presents complaining of 3-4 days of right lower quadrant pain.  No associated fevers, nausea, vomiting, diarrhea or bloody stools.  On exam patient is overall well-appearing, mild tachycardia with soft blood pressure, vitals otherwise normal.  Patient likely dehydrated, has not been drinking well and donated plasma twice this week.  Abdomen focally tender in the right lower quadrant with guarding, no flank tenderness or CVA tenderness.  No urinary symptoms, denies any vaginal discharge is in a monogamous relationship.  Currently trying to get pregnant.  Will start with abdominal labs and pregnancy test.  Differential includes appendicitis, ovarian cyst, ectopic pregnancy, may be musculoskeletal pain as it is very associated with movement, but need to rule out acute intraabdominal pathology.  Will give fluid bolus for tachycardia.  Negative pregnancy test.  Urine without any evidence of infection.  No leukocytosis and normal hemoglobin, minimal hypokalemia at 3.4, will have patient eat potassium rich foods.  No other electrolyte derangements, kidney and liver function normal.  Lipase normal.  Lab work is reassuring but given focal right lower quadrant tenderness that has not improved will get CT scan to rule out intra-abdominal pathology.  CT shows no acute intra-abdominal or pelvic findings.  Discussed these results with patient and overall reassuring workup.  Tachycardia has resolved after fluid bolus.  Tylenol has helped with pain.  This time patient is stable for discharge home, ibuprofen or Tylenol for pain, encourage patient to drink plenty of fluids.  Strict return precautions discussed.  Patient to [facility]up with [facility].  Patient expresses understanding and is in agreement with plan.  Final Clinical Impressions(s) / ED Diagnoses   Final  diagnoses:  Right lower quadrant abdominal pain    ED Discharge Orders    None       Legrand RamsFord, Helio Lack N, PA-C 08/25/17 1530    Mancel BaleWentz, Elliott, MD 08/26/17 [phone]

## 2017-10-24 ENCOUNTER — Inpatient Hospital Stay (HOSPITAL_COMMUNITY): Payer: Medicaid Other

## 2017-10-24 ENCOUNTER — Inpatient Hospital Stay (HOSPITAL_COMMUNITY)
Admission: AD | Admit: 2017-10-24 | Discharge: 2017-10-24 | Disposition: A | Discharge status: Home / Self Care | Payer: Medicaid Other | Source: Ambulatory Visit | Attending: Obstetrics & Gynecology | Admitting: Obstetrics & Gynecology

## 2017-10-24 ENCOUNTER — Encounter (HOSPITAL_COMMUNITY): Payer: Self-pay | Admitting: *Deleted

## 2017-10-24 DIAGNOSIS — O26891 Other specified pregnancy related conditions, first trimester: Secondary | ICD-10-CM | POA: Diagnosis not present

## 2017-10-24 DIAGNOSIS — O219 Vomiting of pregnancy, unspecified: Secondary | ICD-10-CM

## 2017-10-24 DIAGNOSIS — N898 Other specified noninflammatory disorders of vagina: Secondary | ICD-10-CM | POA: Diagnosis not present

## 2017-10-24 DIAGNOSIS — R109 Unspecified abdominal pain: Secondary | ICD-10-CM

## 2017-10-24 DIAGNOSIS — R102 Pelvic and perineal pain: Secondary | ICD-10-CM | POA: Insufficient documentation

## 2017-10-24 DIAGNOSIS — O210 Mild hyperemesis gravidarum: Secondary | ICD-10-CM | POA: Diagnosis not present

## 2017-10-24 DIAGNOSIS — Z3A01 Less than 8 weeks gestation of pregnancy: Secondary | ICD-10-CM | POA: Insufficient documentation

## 2017-10-24 LAB — URINALYSIS, ROUTINE W REFLEX MICROSCOPIC
BILIRUBIN URINE: NEGATIVE
GLUCOSE, UA: NEGATIVE mg/dL
HGB URINE DIPSTICK: NEGATIVE
Ketones, ur: NEGATIVE mg/dL
Leukocytes, UA: NEGATIVE
Nitrite: NEGATIVE
PH: 6 (ref 5.0–8.0)
Protein, ur: NEGATIVE mg/dL
SPECIFIC GRAVITY, URINE: 1.005 (ref 1.005–1.030)

## 2017-10-24 LAB — WET PREP, GENITAL
Clue Cells Wet Prep HPF POC: NONE SEEN
SPERM: NONE SEEN
TRICH WET PREP: NONE SEEN
Yeast Wet Prep HPF POC: NONE SEEN

## 2017-10-24 LAB — HCG, QUANTITATIVE, PREGNANCY: hCG, Beta Chain, Quant, S: 109927 m[IU]/mL — ABNORMAL HIGH (ref <5)

## 2017-10-24 LAB — POCT PREGNANCY, URINE: PREG TEST UR: POSITIVE — AB

## 2017-10-24 MED ORDER — PROMETHAZINE HCL 25 MG PO TABS: 12.5000 mg | ORAL_TABLET | Freq: Four times a day (QID) | ORAL | 0 refills | Status: DC | PRN

## 2017-10-24 NOTE — Discharge Instructions (Signed)
First Trimester of Pregnancy °The first trimester of pregnancy is from week 1 until the end of week 13 (months 1 through 3). A week after a sperm fertilizes an egg, the egg will implant on the wall of the uterus. This embryo will begin to develop into a baby. Genes from you and your partner will form the baby. The female genes will determine whether the baby will be a boy or a girl. At 6-8 weeks, the eyes and face will be formed, and the heartbeat can be seen on ultrasound. At the end of 12 weeks, all the baby's organs will be formed. °Now that you are pregnant, you will want to do everything you can to have a healthy baby. Two of the most important things are to get good prenatal care and to follow your health care provider's instructions. Prenatal care is all the medical care you receive before the baby's birth. This care will help prevent, find, and treat any problems during the pregnancy and childbirth. °Body changes during your first trimester °Your body goes through many changes during pregnancy. The changes vary from woman to woman. °· You may gain or lose a couple of pounds at first. °· You may feel sick to your stomach (nauseous) and you may throw up (vomit). If the vomiting is uncontrollable, call your health care provider. °· You may tire easily. °· You may develop headaches that can be relieved by medicines. All medicines should be approved by your health care provider. °· You may urinate more often. Painful urination may mean you have a bladder infection. °· You may develop heartburn as a result of your pregnancy. °· You may develop constipation because certain hormones are causing the muscles that push stool through your intestines to slow down. °· You may develop hemorrhoids or swollen veins (varicose veins). °· Your breasts may begin to grow larger and become tender. Your nipples may stick out more, and the tissue that surrounds them (areola) may become darker. °· Your gums may bleed and may be  sensitive to brushing and flossing. °· Dark spots or blotches (chloasma, mask of pregnancy) may develop on your face. This will likely fade after the baby is born. °· Your menstrual periods will stop. °· You may have a loss of appetite. °· You may develop cravings for certain kinds of food. °· You may have changes in your emotions from day to day, such as being excited to be pregnant or being concerned that something may go wrong with the pregnancy and baby. °· You may have more vivid and strange dreams. °· You may have changes in your hair. These can include thickening of your hair, rapid growth, and changes in texture. Some women also have hair loss during or after pregnancy, or hair that feels dry or thin. Your hair will most likely return to normal after your baby is born. ° °What to expect at prenatal visits °During a routine prenatal visit: °· You will be weighed to make sure you and the baby are growing normally. °· Your blood pressure will be taken. °· Your abdomen will be measured to track your baby's growth. °· The fetal heartbeat will be listened to between weeks 10 and 14 of your pregnancy. °· Test results from any previous visits will be discussed. ° °Your health care provider may ask you: °· How you are feeling. °· If you are feeling the baby move. °· If you have had any abnormal symptoms, such as leaking fluid, bleeding, severe headaches,   or abdominal cramping. °· If you are using any tobacco products, including cigarettes, chewing tobacco, and electronic cigarettes. °· If you have any questions. ° °Other tests that may be performed during your first trimester include: °· Blood tests to find your blood type and to check for the presence of any previous infections. The tests will also be used to check for low iron levels (anemia) and protein on red blood cells (Rh antibodies). Depending on your risk factors, or if you previously had diabetes during pregnancy, you may have tests to check for high blood  sugar that affects pregnant women (gestational diabetes). °· Urine tests to check for infections, diabetes, or protein in the urine. °· An ultrasound to confirm the proper growth and development of the baby. °· Fetal screens for spinal cord problems (spina bifida) and Down syndrome. °· HIV (human immunodeficiency virus) testing. Routine prenatal testing includes screening for HIV, unless you choose not to have this test. °· You may need other tests to make sure you and the baby are doing well. ° °Follow these instructions at home: °Medicines °· Follow your health care provider's instructions regarding medicine use. Specific medicines may be either safe or unsafe to take during pregnancy. °· Take a prenatal vitamin that contains at least 600 micrograms (mcg) of folic acid. °· If you develop constipation, try taking a stool softener if your health care provider approves. °Eating and drinking °· Eat a balanced diet that includes fresh fruits and vegetables, whole grains, good sources of protein such as meat, eggs, or tofu, and low-fat dairy. Your health care provider will help you determine the amount of weight gain that is right for you. °· Avoid raw meat and uncooked cheese. These carry germs that can cause birth defects in the baby. °· Eating four or five small meals rather than three large meals a day may help relieve nausea and vomiting. If you start to feel nauseous, eating a few soda crackers can be helpful. Drinking liquids between meals, instead of during meals, also seems to help ease nausea and vomiting. °· Limit foods that are high in fat and processed sugars, such as fried and sweet foods. °· To prevent constipation: °? Eat foods that are high in fiber, such as fresh fruits and vegetables, whole grains, and beans. °? Drink enough fluid to keep your urine clear or pale yellow. °Activity °· Exercise only as directed by your health care provider. Most women can continue their usual exercise routine during  pregnancy. Try to exercise for 30 minutes at least 5 days a week. Exercising will help you: °? Control your weight. °? Stay in shape. °? Be prepared for labor and delivery. °· Experiencing pain or cramping in the lower abdomen or lower back is a good sign that you should stop exercising. Check with your health care provider before continuing with normal exercises. °· Try to avoid standing for long periods of time. Move your legs often if you must stand in one place for a long time. °· Avoid heavy lifting. °· Wear low-heeled shoes and practice good posture. °· You may continue to have sex unless your health care provider tells you not to. °Relieving pain and discomfort °· Wear a good support bra to relieve breast tenderness. °· Take warm sitz baths to soothe any pain or discomfort caused by hemorrhoids. Use hemorrhoid cream if your health care provider approves. °· Rest with your legs elevated if you have leg cramps or low back pain. °· If you develop   varicose veins in your legs, wear support hose. Elevate your feet for 15 minutes, 3-4 times a day. Limit salt in your diet. Prenatal care  Schedule your prenatal visits by the twelfth week of pregnancy. They are usually scheduled monthly at first, then more often in the last 2 months before delivery.  Write down your questions. Take them to your prenatal visits.  Keep all your prenatal visits as told by your health care provider. This is important. Safety  Wear your seat belt at all times when driving.  Make a list of emergency phone numbers, including numbers for family, friends, the hospital, and police and fire departments. General instructions  Ask your health care provider for a referral to a local prenatal education class. Begin classes no later than the beginning of month 6 of your pregnancy.  Ask for help if you have counseling or nutritional needs during pregnancy. Your health care provider can offer advice or refer you to specialists for help  with various needs.  Do not use hot tubs, steam rooms, or saunas.  Do not douche or use tampons or scented sanitary pads.  Do not cross your legs for long periods of time.  Avoid cat litter boxes and soil used by cats. These carry germs that can cause birth defects in the baby and possibly loss of the fetus by miscarriage or stillbirth.  Avoid all smoking, herbs, alcohol, and medicines not prescribed by your health care provider. Chemicals in these products affect the formation and growth of the baby.  Do not use any products that contain nicotine or tobacco, such as cigarettes and e-cigarettes. If you need help quitting, ask your health care provider. You may receive counseling support and other resources to help you quit.  Schedule a dentist appointment. At home, brush your teeth with a soft toothbrush and be gentle when you floss. Contact a health care provider if:  You have dizziness.  You have mild pelvic cramps, pelvic pressure, or nagging pain in the abdominal area.  You have persistent nausea, vomiting, or diarrhea.  You have a bad smelling vaginal discharge.  You have pain when you urinate.  You notice increased swelling in your face, hands, legs, or ankles.  You are exposed to fifth disease or chickenpox.  You are exposed to [place] measles (rubella) and have never had it. Get help right away if:  You have a fever.  You are leaking fluid from your vagina.  You have spotting or bleeding from your vagina.  You have severe abdominal cramping or pain.  You have rapid weight gain or loss.  You vomit blood or material that looks like coffee grounds.  You develop a severe headache.  You have shortness of breath.  You have any kind of trauma, such as from a fall or a car accident. Summary  The first trimester of pregnancy is from week 1 until the end of week 13 (months 1 through 3).  Your body goes through many changes during pregnancy. The changes vary from  woman to woman.  You will have routine prenatal visits. During those visits, your health care provider will examine you, discuss any test results you may have, and talk with you about how you are feeling. This information is not intended to replace advice given to you by your health care provider. Make sure you discuss any questions you have with your health care provider. Document Released: 07/24/2001 Document Revised: 07/11/2016 Document Reviewed: 07/11/2016 Elsevier Interactive Patient Education  2018 [employer] ° °Safe Medications in Pregnancy  ° °Acne: °Benzoyl Peroxide °Salicylic Acid ° °Backache/Headache: °Tylenol: 2 regular strength every 4 hours OR °             2 Extra strength every 6 hours ° °Colds/Coughs/Allergies: °Benadryl (alcohol free) 25 mg every 6 hours as needed °Breath right strips °Claritin °Cepacol throat lozenges °Chloraseptic throat spray °Cold-Eeze- up to three times per day °Cough drops, alcohol free °Flonase (by prescription only) °Guaifenesin °Mucinex °Robitussin DM (plain only, alcohol free) °Saline nasal spray/drops °Sudafed (pseudoephedrine) & Actifed ** use only after [redacted] weeks gestation and if you do not have high blood pressure °Tylenol °Vicks Vaporub °Zinc lozenges °Zyrtec  ° °Constipation: °Colace °Ducolax suppositories °Fleet enema °Glycerin suppositories °Metamucil °Milk of magnesia °Miralax °Senokot °Smooth move tea ° °Diarrhea: °Kaopectate °Imodium A-D ° °*NO pepto Bismol ° °Hemorrhoids: °Anusol °Anusol HC °Preparation H °Tucks ° °Indigestion: °Tums °Maalox °Mylanta °Zantac  °Pepcid ° °Insomnia: °Benadryl (alcohol free) 25mg every 6 hours as needed °Tylenol PM °Unisom, no Gelcaps ° °Leg Cramps: °Tums °MagGel ° °Nausea/Vomiting:  °Bonine °Dramamine °Emetrol °Ginger extract °Sea bands °Meclizine  °Nausea medication to take during pregnancy:  °Unisom (doxylamine succinate 25 mg tablets) Take one tablet daily at bedtime. If symptoms are not adequately controlled, the dose  can be increased to a maximum recommended dose of two tablets daily (1/2 tablet in the morning, 1/2 tablet mid-afternoon and one at bedtime). °Vitamin B6 100mg tablets. Take one tablet twice a day (up to 200 mg per day). ° °Skin Rashes: °Aveeno products °Benadryl cream or 25mg every 6 hours as needed °Calamine Lotion °1% cortisone cream ° °Yeast infection: °Gyne-lotrimin 7 °Monistat 7 ° ° °**If taking multiple medications, please check labels to avoid duplicating the same active ingredients °**take medication as directed on the label °** Do not exceed 4000 mg of tylenol in 24 hours °**Do not take medications that contain aspirin or ibuprofen ° ° ° ° °

## 2017-10-24 NOTE — MAU Note (Signed)
Pt reports having abd pain sharp shooting pain since she found out she has been pregnant.. Pt reports she has a poor appetite unable to sleep due to the pains.

## 2017-10-24 NOTE — MAU Provider Note (Signed)
History     CSN: [id number]  Arrival date and time: 10/24/17 1638   First Provider Initiated Contact with Patient 10/24/17 1934      Chief Complaint  Patient presents with  . Pelvic Pain   Pelvic Pain  The patient's primary symptoms include pelvic pain. The patient's pertinent negatives include no vaginal discharge. This is a new problem. Episode onset: approx 2 weeks. The problem occurs constantly. The problem has been gradually worsening. Pain severity now: 6/10. The problem affects both sides. She is pregnant. Associated symptoms include nausea. Pertinent negatives include no chills, dysuria, fever, frequency, urgency or vomiting. The vaginal discharge was normal. There has been no bleeding. She has tried nothing for the symptoms. She is sexually active. She uses nothing for contraception. Her menstrual history has been regular (LMP 1/09/20/17).    History reviewed. No pertinent past medical history.  Past Surgical History:  Procedure Laterality Date  . CESAREAN SECTION      History reviewed. No pertinent family history.  Social History   Tobacco Use  . Smoking status: Never Smoker  . Smokeless tobacco: Never Used  Substance Use Topics  . Alcohol use: No  . Drug use: No    Allergies: No Known Allergies  Medications Prior to Admission  Medication Sig Dispense Refill Last Dose  . meclizine (ANTIVERT) 25 MG tablet Take 1 tablet (25 mg total) by mouth 2 (two) times daily as needed for dizziness. (Patient not taking: Reported on 03/27/2015) 20 tablet 0 Not Taking at Unknown time  . meloxicam (MOBIC) 7.5 MG tablet Take 1 tablet (7.5 mg total) by mouth daily. (Patient not taking: Reported on 03/12/2015) 30 tablet 0 Not Taking at Unknown time  . traMADol (ULTRAM) 50 MG tablet Take 1 tablet (50 mg total) by mouth every 6 (six) hours as needed. (Patient not taking: Reported on 03/12/2015) 15 tablet 0 Not Taking at Unknown time  . traMADol (ULTRAM) 50 MG tablet Take 1 tablet (50 mg  total) by mouth every 6 (six) hours as needed. (Patient not taking: Reported on 08/25/2017) 20 tablet 0 Not Taking at Unknown time  . triamcinolone cream (KENALOG) 0.1 % Apply 1 application topically 2 (two) times daily. (Patient not taking: Reported on 08/25/2017) 30 g 0 Not Taking at Unknown time    Review of Systems  Constitutional: Negative for chills and fever.  Gastrointestinal: Positive for nausea. Negative for vomiting.  Genitourinary: Positive for pelvic pain. Negative for dysuria, frequency, urgency, vaginal bleeding and vaginal discharge.   Physical Exam   Blood pressure (!) 113/59, pulse (!) 101, temperature 98.8 F (37.1 C), resp. rate 18, height 5' (1.524 m), weight 115 lb (52.2 kg), last menstrual period 09/09/2017.  Physical Exam  Nursing note and vitals reviewed. Constitutional: She is oriented to person, place, and time. She appears well-developed and well-nourished. No distress.  HENT:  Head: Normocephalic.  Cardiovascular: Normal rate.  Respiratory: Effort normal.  GI: Soft. There is no tenderness. There is no rebound.  Neurological: She is alert and oriented to person, place, and time.  Skin: Skin is warm and dry.  Psychiatric: She has a normal mood and affect.   Results for orders placed or performed during the hospital encounter of 10/24/17 (from the past 24 hour(s))  Urinalysis, Routine w reflex microscopic     Status: Abnormal   Collection Time: 10/24/17  4:41 PM  Result Value Ref Range   Color, Urine YELLOW YELLOW   APPearance HAZY (A) CLEAR   Specific Gravity,  Urine 1.005 1.005 - 1.030   pH 6.0 5.0 - 8.0   Glucose, UA NEGATIVE NEGATIVE mg/dL   Hgb urine dipstick NEGATIVE NEGATIVE   Bilirubin Urine NEGATIVE NEGATIVE   Ketones, ur NEGATIVE NEGATIVE mg/dL   Protein, ur NEGATIVE NEGATIVE mg/dL   Nitrite NEGATIVE NEGATIVE   Leukocytes, UA NEGATIVE NEGATIVE  Pregnancy, urine POC     Status: Abnormal   Collection Time: 10/24/17  4:54 PM  Result Value Ref  Range   Preg Test, Ur POSITIVE (A) NEGATIVE  hCG, quantitative, pregnancy     Status: Abnormal   Collection Time: 10/24/17  6:23 PM  Result Value Ref Range   hCG, Beta Chain, Quant, S 109,927 (H) <5 mIU/mL   [place] Ob Comp Less 14 Wks  Result Date: 10/24/2017 CLINICAL DATA:  32 year old pregnant female with pelvic pain. LMP: 09/09/2017 corresponding to an estimated gestational age of [redacted] weeks, 3 days. EXAM: OBSTETRIC <14 WK US AND TRANSVAGINAL OB US TECHNIQUE: Both transabdominal and transvaginal ultrasound examinations were performed for complete evaluation of the gestation as well as the maternal uterus, adnexal regions, and pelvic cul-de-sac. Transvaginal technique was performed to assess early pregnancy. COMPARISON:  None. FINDINGS: Intrauterine gestational sac: Single Yolk sac:  Seen Embryo:  Present Cardiac Activity: Detected Heart Rate: 132 bpm CRL: 7 mm   6 w   3 d                  US EDC: 06/16/2018 Subchorionic hemorrhage:  None visualized. Maternal uterus/adnexae: The maternal ovaries appear unremarkable. The right ovary measures 2.5 x 2.9 x 2.2 cm and the left ovary measures 2.1 x 1.5 x 2.5 cm. There is a corpus luteum in the right ovary. No free fluid within the pelvis. IMPRESSION: Single live intrauterine pregnancy with an estimated gestational age of [redacted] weeks, 3 days based on today's ultrasound concordant with LMP. Electronically Signed   By: Elgie CollardArash  Radparvar M.D.   On: 10/24/2017 20:35   [place] Ob Transvaginal  Result Date: 10/24/2017 CLINICAL DATA:  32 year old pregnant female with pelvic pain. LMP: 09/09/2017 corresponding to an estimated gestational age of [redacted] weeks, 3 days. EXAM: OBSTETRIC <14 WK US AND TRANSVAGINAL OB US TECHNIQUE: Both transabdominal and transvaginal ultrasound examinations were performed for complete evaluation of the gestation as well as the maternal uterus, adnexal regions, and pelvic cul-de-sac. Transvaginal technique was performed to assess early pregnancy. COMPARISON:   None. FINDINGS: Intrauterine gestational sac: Single Yolk sac:  Seen Embryo:  Present Cardiac Activity: Detected Heart Rate: 132 bpm CRL: 7 mm   6 w   3 d                  US EDC: 06/16/2018 Subchorionic hemorrhage:  None visualized. Maternal uterus/adnexae: The maternal ovaries appear unremarkable. The right ovary measures 2.5 x 2.9 x 2.2 cm and the left ovary measures 2.1 x 1.5 x 2.5 cm. There is a corpus luteum in the right ovary. No free fluid within the pelvis. IMPRESSION: Single live intrauterine pregnancy with an estimated gestational age of [redacted] weeks, 3 days based on today's ultrasound concordant with LMP. Electronically Signed   By: Elgie CollardArash  Radparvar M.D.   On: 10/24/2017 20:35   MAU Course  Procedures  MDM   Assessment and Plan   1. Abdominal pain during pregnancy, first trimester   2. [redacted] weeks gestation of pregnancy   3. Nausea and vomiting in pregnancy    DC home Comfort measures reviewed  1st Trimester precautions  RX: phenergan PRN #[id number] Return to MAU as needed FU with OB as planned  Follow-up Information    Department, [facility] Follow up.   Contact information: [address] [place] [address] [phone]            Thressa Sheller 10/24/2017, 7:36 PM

## 2017-10-25 LAB — GC/CHLAMYDIA PROBE AMP (~~LOC~~) NOT AT ARMC
CHLAMYDIA, DNA PROBE: NEGATIVE
NEISSERIA GONORRHEA: NEGATIVE

## 2017-10-25 LAB — HIV ANTIBODY (ROUTINE TESTING W REFLEX): HIV Screen 4th Generation wRfx: NONREACTIVE

## 2017-11-11 ENCOUNTER — Other Ambulatory Visit (HOSPITAL_COMMUNITY): Payer: Self-pay | Admitting: Nurse Practitioner

## 2017-11-11 DIAGNOSIS — Z369 Encounter for antenatal screening, unspecified: Secondary | ICD-10-CM

## 2017-11-11 DIAGNOSIS — Z3A13 13 weeks gestation of pregnancy: Secondary | ICD-10-CM

## 2017-11-11 LAB — OB RESULTS CONSOLE RUBELLA ANTIBODY, IGM: RUBELLA: IMMUNE

## 2017-11-11 LAB — OB RESULTS CONSOLE HEPATITIS B SURFACE ANTIGEN: HEP B S AG: NEGATIVE

## 2017-11-11 LAB — OB RESULTS CONSOLE RPR: RPR: NONREACTIVE

## 2017-11-11 LAB — OB RESULTS CONSOLE ANTIBODY SCREEN: Antibody Screen: NEGATIVE

## 2017-11-11 LAB — OB RESULTS CONSOLE HIV ANTIBODY (ROUTINE TESTING): HIV: NONREACTIVE

## 2017-11-11 LAB — OB RESULTS CONSOLE ABO/RH: RH Type: POSITIVE

## 2017-11-11 LAB — OB RESULTS CONSOLE GC/CHLAMYDIA
CHLAMYDIA, DNA PROBE: NEGATIVE
Gonorrhea: NEGATIVE

## 2017-12-10 ENCOUNTER — Ambulatory Visit (HOSPITAL_COMMUNITY)
Admission: RE | Admit: 2017-12-10 | Discharge: 2017-12-10 | Disposition: A | Discharge status: Home / Self Care | Payer: Medicaid Other | Source: Ambulatory Visit | Attending: Nurse Practitioner | Admitting: Nurse Practitioner

## 2017-12-10 ENCOUNTER — Encounter (HOSPITAL_COMMUNITY): Payer: Self-pay

## 2017-12-10 ENCOUNTER — Other Ambulatory Visit (HOSPITAL_COMMUNITY): Payer: Self-pay | Admitting: Nurse Practitioner

## 2017-12-10 DIAGNOSIS — Z3A13 13 weeks gestation of pregnancy: Secondary | ICD-10-CM

## 2017-12-10 DIAGNOSIS — O34219 Maternal care for unspecified type scar from previous cesarean delivery: Secondary | ICD-10-CM | POA: Diagnosis present

## 2017-12-10 DIAGNOSIS — Z3682 Encounter for antenatal screening for nuchal translucency: Secondary | ICD-10-CM

## 2017-12-10 DIAGNOSIS — Z363 Encounter for antenatal screening for malformations: Secondary | ICD-10-CM | POA: Diagnosis not present

## 2017-12-10 DIAGNOSIS — Z369 Encounter for antenatal screening, unspecified: Secondary | ICD-10-CM

## 2017-12-10 NOTE — Addendum Note (Signed)
Encounter addended by: Lenise Arena, RDMS on: 12/10/2017 11:54 AM  Actions taken: Imaging Exam ended

## 2017-12-16 ENCOUNTER — Other Ambulatory Visit: Payer: Self-pay

## 2018-03-27 ENCOUNTER — Other Ambulatory Visit: Payer: Self-pay | Admitting: Obstetrics and Gynecology

## 2018-04-05 ENCOUNTER — Emergency Department (HOSPITAL_COMMUNITY)
Admission: EM | Admit: 2018-04-05 | Discharge: 2018-04-05 | Disposition: A | Discharge status: Home / Self Care | Payer: Medicaid Other | Attending: Emergency Medicine | Admitting: Emergency Medicine

## 2018-04-05 ENCOUNTER — Emergency Department (HOSPITAL_COMMUNITY): Payer: Medicaid Other

## 2018-04-05 ENCOUNTER — Encounter (HOSPITAL_COMMUNITY): Payer: Self-pay | Admitting: Emergency Medicine

## 2018-04-05 DIAGNOSIS — Y998 Other external cause status: Secondary | ICD-10-CM | POA: Diagnosis not present

## 2018-04-05 DIAGNOSIS — Y929 Unspecified place or not applicable: Secondary | ICD-10-CM | POA: Insufficient documentation

## 2018-04-05 DIAGNOSIS — Y939 Activity, unspecified: Secondary | ICD-10-CM | POA: Diagnosis not present

## 2018-04-05 DIAGNOSIS — Z3A29 29 weeks gestation of pregnancy: Secondary | ICD-10-CM | POA: Diagnosis not present

## 2018-04-05 DIAGNOSIS — S99912A Unspecified injury of left ankle, initial encounter: Secondary | ICD-10-CM | POA: Diagnosis present

## 2018-04-05 DIAGNOSIS — O9A213 Injury, poisoning and certain other consequences of external causes complicating pregnancy, third trimester: Secondary | ICD-10-CM | POA: Diagnosis not present

## 2018-04-05 DIAGNOSIS — S93402A Sprain of unspecified ligament of left ankle, initial encounter: Secondary | ICD-10-CM | POA: Diagnosis not present

## 2018-04-05 DIAGNOSIS — W010XXA Fall on same level from slipping, tripping and stumbling without subsequent striking against object, initial encounter: Secondary | ICD-10-CM | POA: Insufficient documentation

## 2018-04-05 MED ORDER — ACETAMINOPHEN 500 MG PO TABS
1000.0000 mg | ORAL_TABLET | Freq: Once | ORAL | Status: AC
  Administered 2018-04-05: 1000 mg via ORAL
  Filled 2018-04-05: qty 2

## 2018-04-05 NOTE — ED Provider Notes (Signed)
Emergency Department Provider Note   I have reviewed the triage vital signs and the nursing notes.   HISTORY  Chief Complaint Ankle Pain   HPI Carmen Wheeler is a 32 y.o. female at 7429 weeks pregnancy who slipped on some mud this morning and fell twisting her left ankle and landing on her left knee but has persistent left ankle pain.  Unable to bear weight.  No injuries elsewhere.  Did not hit her abdomen.  Still feels baby moving no vaginal bleeding or leakage of fluid. No other associated or modifying symptoms.    Past Medical History:  Diagnosis Date  . Medical history non-contributory     There are no active problems to display for this patient.   Past Surgical History:  Procedure Laterality Date  . CESAREAN SECTION      Current Outpatient Rx  . Order #: [id number] Class: Historical Med    Allergies Patient has no known allergies.  No family history on file.  Social History Social History   Tobacco Use  . Smoking status: Never Smoker  . Smokeless tobacco: Never Used  Substance Use Topics  . Alcohol use: No  . Drug use: No    Review of Systems  All other systems negative except as documented in the HPI. All pertinent positives and negatives as reviewed in the HPI. ____________________________________________   PHYSICAL EXAM:  VITAL SIGNS: ED Triage Vitals  Enc Vitals Group     BP 04/05/18 0751 106/65     Pulse Rate 04/05/18 0751 94     Resp 04/05/18 0751 16     Temp 04/05/18 0751 98.5 F (36.9 C)     Temp Source 04/05/18 0751 Oral     SpO2 04/05/18 0751 99 %     Weight --      Height --      Head Circumference --      Peak Flow --      Pain Score 04/05/18 0756 7     Pain Loc --      Pain Edu? --      Excl. in GC? --     Constitutional: Alert and oriented. Well appearing and in no acute distress. Eyes: Conjunctivae are normal. PERRL. EOMI. Head: Atraumatic. Nose: No congestion/rhinnorhea. Mouth/Throat: Mucous membranes are  moist.  Oropharynx non-erythematous. Neck: No stridor.  No meningeal signs.   Cardiovascular: Normal rate, regular rhythm. Good peripheral circulation. Grossly normal heart sounds.   Respiratory: Normal respiratory effort.  No retractions. Lungs CTAB. Gastrointestinal: Soft and nontender. No distention.  Musculoskeletal: No lower extremity tenderness nor edema. No gross deformities of extremities. TTP to left distal tibia. Some pain with ROM of ankle joint. No proximal fibular ttp.  Neurologic:  Normal speech and language. No gross focal neurologic deficits are appreciated.  Skin:  Skin is warm, dry and intact. No rash noted.  ____________________________________________  RADIOLOGY  Dg Ankle Complete Left  Result Date: 04/05/2018 CLINICAL DATA:  Fall this morning.  Lateral ankle pain EXAM: LEFT ANKLE COMPLETE - 3+ VIEW COMPARISON:  Foot radiographs from 06/22/2014 FINDINGS: No appreciable fracture or significant degree of soft tissue swelling over the malleoli. Plafond and talar dome appear intact. IMPRESSION: Negative. If pain persists despite conservative therapy, MRI may be warranted for further characterization. Electronically Signed   By: Gaylyn RongWalter  Liebkemann M.D.   On: 04/05/2018 09:03    ____________________________________________   INITIAL IMPRESSION / ASSESSMENT AND PLAN / ED COURSE  No concern for pregnancy trauma.  Will x-ray  ankle but low suspicion for actual fracture likely just a mild sprain. Tylenol for pain.  Sprain. Air splint applied. Tylenol for pain.   I discussed with the patient the possibility of a missed fracture. I told them that sometimes they are not seen on initial x-ray but if they had persistent pain for 5 to 7 days they would need to follow-up with their primary doctor or an orthopedic doctor to get repeat evaluation and x-ray to evaluate for missed fracture or ligamentous injury and they stated understanding. Will employ RICE therapy with NSAIDs/tylenol in  mean time.   Pertinent labs & imaging results that were available during my care of the patient were reviewed by me and considered in my medical decision making (see chart for details).  ____________________________________________  FINAL CLINICAL IMPRESSION(S) / ED DIAGNOSES  Final diagnoses:  Sprain of left ankle, unspecified ligament, initial encounter     MEDICATIONS GIVEN DURING THIS VISIT:  Medications  acetaminophen (TYLENOL) tablet 1,000 mg (1,000 mg Oral Given 04/05/18 0839)     NEW OUTPATIENT MEDICATIONS STARTED DURING THIS VISIT:  Discharge Medication List as of 04/05/2018  9:25 AM      Note:  This note was prepared with assistance of Dragon voice recognition software. Occasional wrong-word or sound-a-like substitutions may have occurred due to the inherent limitations of voice recognition software.   Ashley Bultema, Barbara Cower, MD 04/06/18 [phone]

## 2018-04-05 NOTE — ED Notes (Signed)
Patient transported to X-ray 

## 2018-04-05 NOTE — ED Triage Notes (Signed)
Patient here from home with complaints of left ankle pain after fall. [redacted] weeks pregnant.

## 2018-04-11 ENCOUNTER — Other Ambulatory Visit: Payer: Self-pay

## 2018-04-11 ENCOUNTER — Encounter (HOSPITAL_COMMUNITY): Payer: Self-pay

## 2018-04-11 ENCOUNTER — Inpatient Hospital Stay (HOSPITAL_COMMUNITY)
Admission: AD | Admit: 2018-04-11 | Discharge: 2018-04-11 | Disposition: A | Discharge status: Home / Self Care | Payer: Medicaid Other | Source: Ambulatory Visit | Attending: Obstetrics & Gynecology | Admitting: Obstetrics & Gynecology

## 2018-04-11 DIAGNOSIS — Z3A30 30 weeks gestation of pregnancy: Secondary | ICD-10-CM | POA: Diagnosis not present

## 2018-04-11 DIAGNOSIS — O23593 Infection of other part of genital tract in pregnancy, third trimester: Secondary | ICD-10-CM | POA: Insufficient documentation

## 2018-04-11 DIAGNOSIS — N760 Acute vaginitis: Secondary | ICD-10-CM

## 2018-04-11 DIAGNOSIS — N939 Abnormal uterine and vaginal bleeding, unspecified: Secondary | ICD-10-CM | POA: Diagnosis present

## 2018-04-11 DIAGNOSIS — Z3493 Encounter for supervision of normal pregnancy, unspecified, third trimester: Secondary | ICD-10-CM

## 2018-04-11 DIAGNOSIS — B9689 Other specified bacterial agents as the cause of diseases classified elsewhere: Secondary | ICD-10-CM | POA: Diagnosis not present

## 2018-04-11 HISTORY — DX: Anemia, unspecified: D64.9

## 2018-04-11 LAB — URINALYSIS, ROUTINE W REFLEX MICROSCOPIC
BILIRUBIN URINE: NEGATIVE
GLUCOSE, UA: NEGATIVE mg/dL
HGB URINE DIPSTICK: NEGATIVE
KETONES UR: NEGATIVE mg/dL
Leukocytes, UA: NEGATIVE
NITRITE: NEGATIVE
PH: 6 (ref 5.0–8.0)
Protein, ur: NEGATIVE mg/dL
SPECIFIC GRAVITY, URINE: 1.015 (ref 1.005–1.030)

## 2018-04-11 MED ORDER — METRONIDAZOLE 0.75 % VA GEL: 1.0000 | Freq: Two times a day (BID) | VAGINAL | 0 refills | Status: DC

## 2018-04-11 NOTE — MAU Note (Signed)
Saw some spotting this morning when she used the restroom, pinkish.  Unsure if from shaving or what. little bit of cramping in lower abd, prior to seeing bleeding

## 2018-04-11 NOTE — Discharge Instructions (Signed)
Bacterial Vaginosis Bacterial vaginosis is a vaginal infection that occurs when the normal balance of bacteria in the vagina is disrupted. It results from an overgrowth of certain bacteria. This is the most common vaginal infection among women ages 15-44. Because bacterial vaginosis increases your risk for STIs (sexually transmitted infections), getting treated can help reduce your risk for chlamydia, gonorrhea, herpes, and HIV (human immunodeficiency virus). Treatment is also important for preventing complications in pregnant women, because this condition can cause an early (premature) delivery. What are the causes? This condition is caused by an increase in harmful bacteria that are normally present in small amounts in the vagina. However, the reason that the condition develops is not fully understood. What increases the risk? The following factors may make you more likely to develop this condition:  Having a new sexual partner or multiple sexual partners.  Having unprotected sex.  Douching.  Having an intrauterine device (IUD).  Smoking.  Drug and alcohol abuse.  Taking certain antibiotic medicines.  Being pregnant.  You cannot get bacterial vaginosis from toilet seats, bedding, swimming pools, or contact with objects around you. What are the signs or symptoms? Symptoms of this condition include:  Grey or white vaginal discharge. The discharge can also be watery or foamy.  A fish-like odor with discharge, especially after sexual intercourse or during menstruation.  Itching in and around the vagina.  Burning or pain with urination.  Some women with bacterial vaginosis have no signs or symptoms. How is this diagnosed? This condition is diagnosed based on:  Your medical history.  A physical exam of the vagina.  Testing a sample of vaginal fluid under a microscope to look for a large amount of bad bacteria or abnormal cells. Your health care provider may use a cotton swab  or a small wooden spatula to collect the sample.  How is this treated? This condition is treated with antibiotics. These may be given as a pill, a vaginal cream, or a medicine that is put into the vagina (suppository). If the condition comes back after treatment, a second round of antibiotics may be needed. Follow these instructions at home: Medicines  Take over-the-counter and prescription medicines only as told by your health care provider.  Take or use your antibiotic as told by your health care provider. Do not stop taking or using the antibiotic even if you start to feel better. General instructions  If you have a female sexual partner, tell her that you have a vaginal infection. She should see her health care provider and be treated if she has symptoms. If you have a female sexual partner, he does not need treatment.  During treatment: ? Avoid sexual activity until you finish treatment. ? Do not douche. ? Avoid alcohol as directed by your health care provider. ? Avoid breastfeeding as directed by your health care provider.  Drink enough water and fluids to keep your urine clear or pale yellow.  Keep the area around your vagina and rectum clean. ? Wash the area daily with warm water. ? Wipe yourself from front to back after using the toilet.  Keep all follow-up visits as told by your health care provider. This is important. How is this prevented?  Do not douche.  Wash the outside of your vagina with warm water only.  Use protection when having sex. This includes latex condoms and dental dams.  Limit how many sexual partners you have. To help prevent bacterial vaginosis, it is best to have sex with just   one partner (monogamous).  Make sure you and your sexual partner are tested for STIs.  Wear cotton or cotton-lined underwear.  Avoid wearing tight pants and pantyhose, especially during summer.  Limit the amount of alcohol that you drink.  Do not use any products that  contain nicotine or tobacco, such as cigarettes and e-cigarettes. If you need help quitting, ask your health care provider.  Do not use illegal drugs. Where to find more information:  Centers for Disease Control and Prevention: www.cdc.gov/std  American Sexual Health Association (ASHA): www.ashastd.org  U.S. Department of Health and Human Services, Office on Women's Health: www.womenshealth.gov/ or https://www.womenshealth.gov/a-z-topics/bacterial-vaginosis Contact a health care provider if:  Your symptoms do not improve, even after treatment.  You have more discharge or pain when urinating.  You have a fever.  You have pain in your abdomen.  You have pain during sex.  You have vaginal bleeding between periods. Summary  Bacterial vaginosis is a vaginal infection that occurs when the normal balance of bacteria in the vagina is disrupted.  Because bacterial vaginosis increases your risk for STIs (sexually transmitted infections), getting treated can help reduce your risk for chlamydia, gonorrhea, herpes, and HIV (human immunodeficiency virus). Treatment is also important for preventing complications in pregnant women, because the condition can cause an early (premature) delivery.  This condition is treated with antibiotic medicines. These may be given as a pill, a vaginal cream, or a medicine that is put into the vagina (suppository). This information is not intended to replace advice given to you by your health care provider. Make sure you discuss any questions you have with your health care provider. Document Released: 07/30/2005 Document Revised: 12/03/2016 Document Reviewed: 04/14/2016 Elsevier Interactive Patient Education  2018 [employer]  

## 2018-04-11 NOTE — MAU Provider Note (Signed)
Chief Complaint: Vaginal Bleeding and Abdominal Pain   None    SUBJECTIVE HPI: Carmen Wheeler is a 32 y.o. [id number] at 9517w4d who presents to [facility] Admissions reporting spotting x1.  FM+.  Denies leaking of fluid.  Denies intercourse.  Uncomplicated pregnancy.  Location: vaginal Quality: min Severity: 2/10 on pain scale Duration: today   Past Medical History:  Diagnosis Date  . Anemia    OB History  Gravida Para Term Preterm AB Living  6 3 3   2 3   SAB TAB Ectopic Multiple Live Births    2     3    # Outcome Date GA Lbr Len/2nd Weight Sex Delivery Anes PTL Lv  6 Current           5 Term 02/02/07    Judie PetitM CS-LTranv   LIV  4 TAB 2008          3 Term 08/01/04    F CS-LTranv   LIV  2 TAB 2004          1 Term 10/10/00    Wandalee FerdinandM CS-LTranv   LIV   Past Surgical History:  Procedure Laterality Date  . CESAREAN SECTION    . WISDOM TOOTH EXTRACTION     Social History   Socioeconomic History  . Marital status: Married    Spouse name: Not on file  . Number of children: Not on file  . Years of education: Not on file  . Highest education level: Not on file  Occupational History  . Not on file  Social Needs  . Financial resource strain: Not on file  . Food insecurity:    Worry: Not on file    Inability: Not on file  . Transportation needs:    Medical: Not on file    Non-medical: Not on file  Tobacco Use  . Smoking status: Never Smoker  . Smokeless tobacco: Never Used  Substance and Sexual Activity  . Alcohol use: No  . Drug use: No  . Sexual activity: Yes  Lifestyle  . Physical activity:    Days per week: Not on file    Minutes per session: Not on file  . Stress: Not on file  Relationships  . Social connections:    Talks on phone: Not on file    Gets together: Not on file    Attends religious service: Not on file    Active member of club or organization: Not on file    Attends meetings of clubs or organizations: Not on file    Relationship status: Not on file   . Intimate partner violence:    Fear of current or ex partner: Not on file    Emotionally abused: Not on file    Physically abused: Not on file    Forced sexual activity: Not on file  Other Topics Concern  . Not on file  Social History Narrative  . Not on file   No family history on file. No current facility-administered medications on file prior to encounter.    Current Outpatient Medications on File Prior to Encounter  Medication Sig Dispense Refill  . Prenatal Multivit-Min-Fe-FA (PRENATAL VITAMINS PO) Take by mouth.     No Known Allergies  I have reviewed patient's Past Medical Hx, Surgical Hx, Family Hx, Social Hx, medications and allergies.   Review of Systems  Constitutional: Negative.   HENT: Negative.   Eyes: Negative.   Respiratory: Negative.   Cardiovascular: Negative.   Gastrointestinal: Positive for abdominal pain.  Endocrine: Negative.   Genitourinary: Positive for vaginal bleeding.  Skin: Negative.   Allergic/Immunologic: Negative.   Neurological: Negative.   Hematological: Negative.   Psychiatric/Behavioral: Negative.     OBJECTIVE Patient Vitals for the past 24 hrs:  BP Temp Temp src Pulse Resp SpO2 Weight  04/11/18 1326 (!) 108/58 98.1 F (36.7 C) Oral (!) 110 16 97 % 61.9 kg   Constitutional: Well-developed, well-nourished female in no acute distress.  Cardiovascular: normal rate Respiratory: normal rate and effort.  GI: Abd soft, non-tender, gravid appropriate for gestational age MS: Extremities nontender, no edema, normal ROM Neurologic: Alert and oriented x 4.  GU: Neg CVAT.  SPECULUM EXAM: NEFG, physiologic discharge, no blood noted, cervix clean  BIMANUAL: cervix LTC; uterus appropriate size size, no adnexal tenderness or masses.  No CMT.  FHT 120 accels, no decels, varibility present irreg contractions LAB RESULTS Results for orders placed or performed during the hospital encounter of 04/11/18 (from the past 24 hour(s))  Urinalysis,  Routine w reflex microscopic     Status: None   Collection Time: 04/11/18  2:22 PM  Result Value Ref Range   Color, Urine YELLOW YELLOW   APPearance CLEAR CLEAR   Specific Gravity, Urine 1.015 1.005 - 1.030   pH 6.0 5.0 - 8.0   Glucose, UA NEGATIVE NEGATIVE mg/dL   Hgb urine dipstick NEGATIVE NEGATIVE   Bilirubin Urine NEGATIVE NEGATIVE   Ketones, ur NEGATIVE NEGATIVE mg/dL   Protein, ur NEGATIVE NEGATIVE mg/dL   Nitrite NEGATIVE NEGATIVE   Leukocytes, UA NEGATIVE NEGATIVE    IMAGING   MAU COURSE Orders Placed This Encounter  Procedures  . Urinalysis, Routine w reflex microscopic  wet mount positive clue, neg trich, neg yeast No orders of the defined types were placed in this encounter.   MDM PE, UA, wet pos clue ASSESSMENT BV in pregnancy Reactive NST  PLAN Discharge home in stable condition. Bleeding  Precautions.  Discussed probiotics and use of Flagyl     Kenney Houseman, CNM 04/11/2018  2:51 PM

## 2018-05-26 ENCOUNTER — Encounter (HOSPITAL_COMMUNITY): Payer: Self-pay

## 2018-06-02 NOTE — Patient Instructions (Signed)
Makala Fetterolf  06/02/2018   Your procedure is scheduled on:  06/09/2018  Enter through the Main Entrance of [facility] at 0730 AM.  Pick up the phone at the desk and dial [phone]  Call this number if you have problems the morning of surgery:[phone]  Remember:   Do not eat food:(After Midnight) Desps de medianoche.  Do not drink clear liquids: (After Midnight) Desps de medianoche.  Take these medicines the morning of surgery with A SIP OF WATER: none   Do not wear jewelry, make-up or nail polish.  Do not wear lotions, powders, or perfumes. Do not wear deodorant.  Do not shave 48 hours prior to surgery.  Do not bring valuables to the hospital.  [facility] is not   responsible for any belongings or valuables brought to the hospital.  Contacts, dentures or bridgework may not be worn into surgery.  Leave suitcase in the car. After surgery it may be brought to your room.  For patients admitted to the hospital, checkout time is 11:00 AM the day of              discharge.    N/A   Please read over the following fact sheets that you were given:   Surgical Site Infection Prevention

## 2018-06-06 ENCOUNTER — Encounter (HOSPITAL_COMMUNITY)
Admission: RE | Admit: 2018-06-06 | Discharge: 2018-06-06 | Disposition: A | Discharge status: Home / Self Care | Payer: Medicaid Other | Source: Ambulatory Visit | Attending: Obstetrics and Gynecology | Admitting: Obstetrics and Gynecology

## 2018-06-06 LAB — CBC
HEMATOCRIT: 31.4 % — AB (ref 36.0–46.0)
Hemoglobin: 10.7 g/dL — ABNORMAL LOW (ref 12.0–15.0)
MCH: 31.2 pg (ref 26.0–34.0)
MCHC: 34.1 g/dL (ref 30.0–36.0)
MCV: 91.5 fL (ref 80.0–100.0)
Platelets: 173 10*3/uL (ref 150–400)
RBC: 3.43 MIL/uL — ABNORMAL LOW (ref 3.87–5.11)
RDW: 15.1 % (ref 11.5–15.5)
WBC: 11.9 10*3/uL — AB (ref 4.0–10.5)
nRBC: 0 % (ref 0.0–0.2)

## 2018-06-07 LAB — RPR: RPR Ser Ql: NONREACTIVE

## 2018-06-08 ENCOUNTER — Other Ambulatory Visit: Payer: Self-pay

## 2018-06-08 ENCOUNTER — Encounter (HOSPITAL_COMMUNITY)
Admission: RE | Disposition: A | Discharge status: Home / Self Care | Payer: Self-pay | Source: Home / Self Care | Attending: Obstetrics and Gynecology

## 2018-06-08 ENCOUNTER — Inpatient Hospital Stay (HOSPITAL_COMMUNITY): Payer: Medicaid Other | Admitting: Anesthesiology

## 2018-06-08 ENCOUNTER — Encounter (HOSPITAL_COMMUNITY): Payer: Self-pay | Admitting: *Deleted

## 2018-06-08 ENCOUNTER — Inpatient Hospital Stay (HOSPITAL_COMMUNITY)
Admission: RE | Admit: 2018-06-08 | Discharge: 2018-06-11 | DRG: 788 | Disposition: A | Discharge status: Home / Self Care | Payer: Medicaid Other | Attending: Obstetrics and Gynecology | Admitting: Obstetrics and Gynecology

## 2018-06-08 DIAGNOSIS — D649 Anemia, unspecified: Secondary | ICD-10-CM | POA: Diagnosis present

## 2018-06-08 DIAGNOSIS — O34211 Maternal care for low transverse scar from previous cesarean delivery: Principal | ICD-10-CM | POA: Diagnosis present

## 2018-06-08 DIAGNOSIS — O99824 Streptococcus B carrier state complicating childbirth: Secondary | ICD-10-CM | POA: Diagnosis present

## 2018-06-08 DIAGNOSIS — Z3A38 38 weeks gestation of pregnancy: Secondary | ICD-10-CM | POA: Diagnosis not present

## 2018-06-08 DIAGNOSIS — O9902 Anemia complicating childbirth: Secondary | ICD-10-CM | POA: Diagnosis present

## 2018-06-08 DIAGNOSIS — Z98891 History of uterine scar from previous surgery: Secondary | ICD-10-CM

## 2018-06-08 LAB — TYPE AND SCREEN
ABO/RH(D): A POS
Antibody Screen: NEGATIVE

## 2018-06-08 LAB — ABO/RH: ABO/RH(D): A POS

## 2018-06-08 SURGERY — Surgical Case
Anesthesia: Regional

## 2018-06-08 MED ORDER — NALOXONE HCL 0.4 MG/ML IJ SOLN: 0.4000 mg | INTRAMUSCULAR | Status: DC | PRN

## 2018-06-08 MED ORDER — FENTANYL CITRATE (PF) 100 MCG/2ML IJ SOLN
100.0000 ug | Freq: Once | INTRAMUSCULAR | Status: AC
  Administered 2018-06-08: 100 ug via INTRAVENOUS
  Filled 2018-06-08: qty 2

## 2018-06-08 MED ORDER — CHLOROPROCAINE HCL (PF) 3 % IJ SOLN
INTRAMUSCULAR | Status: DC | PRN
  Administered 2018-06-08: 300 mg

## 2018-06-08 MED ORDER — ACETAMINOPHEN 10 MG/ML IV SOLN
1000.0000 mg | Freq: Four times a day (QID) | INTRAVENOUS | Status: AC
  Administered 2018-06-08: 1000 mg via INTRAVENOUS
  Filled 2018-06-08 (×4): qty 100

## 2018-06-08 MED ORDER — ACETAMINOPHEN 325 MG PO TABS: 650.0000 mg | ORAL_TABLET | ORAL | Status: DC | PRN

## 2018-06-08 MED ORDER — SOD CITRATE-CITRIC ACID 500-334 MG/5ML PO SOLN
30.0000 mL | Freq: Once | ORAL | Status: AC
  Administered 2018-06-08: 30 mL via ORAL
  Filled 2018-06-08: qty 15

## 2018-06-08 MED ORDER — PRENATAL MULTIVITAMIN CH
1.0000 | ORAL_TABLET | Freq: Every day | ORAL | Status: DC
  Administered 2018-06-08 – 2018-06-11 (×4): 1 via ORAL
  Filled 2018-06-08 (×4): qty 1

## 2018-06-08 MED ORDER — OXYTOCIN 10 UNIT/ML IJ SOLN
INTRAMUSCULAR | Status: AC
  Filled 2018-06-08: qty 4

## 2018-06-08 MED ORDER — FENTANYL CITRATE (PF) 100 MCG/2ML IJ SOLN
INTRAMUSCULAR | Status: AC
  Filled 2018-06-08: qty 2

## 2018-06-08 MED ORDER — KETOROLAC TROMETHAMINE 30 MG/ML IJ SOLN: 30.0000 mg | Freq: Four times a day (QID) | INTRAMUSCULAR | Status: AC | PRN

## 2018-06-08 MED ORDER — DIPHENHYDRAMINE HCL 50 MG/ML IJ SOLN
12.5000 mg | INTRAMUSCULAR | Status: DC | PRN
  Administered 2018-06-08: 12.5 mg via INTRAVENOUS
  Filled 2018-06-08: qty 1

## 2018-06-08 MED ORDER — OXYTOCIN 40 UNITS IN LACTATED RINGERS INFUSION - SIMPLE MED
2.5000 [IU]/h | INTRAVENOUS | Status: AC
  Administered 2018-06-08: 2.5 [IU]/h via INTRAVENOUS
  Filled 2018-06-08: qty 1000

## 2018-06-08 MED ORDER — LACTATED RINGERS IV SOLN
INTRAVENOUS | Status: DC | PRN
  Administered 2018-06-08: 07:00:00 via INTRAVENOUS

## 2018-06-08 MED ORDER — FENTANYL CITRATE (PF) 100 MCG/2ML IJ SOLN
25.0000 ug | INTRAMUSCULAR | Status: DC | PRN
  Administered 2018-06-08: 50 ug via INTRAVENOUS

## 2018-06-08 MED ORDER — NALOXONE HCL 4 MG/10ML IJ SOLN
1.0000 ug/kg/h | INTRAVENOUS | Status: DC | PRN
  Filled 2018-06-08: qty 5

## 2018-06-08 MED ORDER — ONDANSETRON HCL 4 MG/2ML IJ SOLN
4.0000 mg | Freq: Three times a day (TID) | INTRAMUSCULAR | Status: DC | PRN
  Administered 2018-06-08: 4 mg via INTRAVENOUS
  Filled 2018-06-08: qty 2

## 2018-06-08 MED ORDER — SIMETHICONE 80 MG PO CHEW
80.0000 mg | CHEWABLE_TABLET | ORAL | Status: DC
  Administered 2018-06-09 – 2018-06-10 (×3): 80 mg via ORAL
  Filled 2018-06-08 (×3): qty 1

## 2018-06-08 MED ORDER — IBUPROFEN 600 MG PO TABS
600.0000 mg | ORAL_TABLET | Freq: Four times a day (QID) | ORAL | Status: DC
  Administered 2018-06-08 – 2018-06-11 (×12): 600 mg via ORAL
  Filled 2018-06-08 (×12): qty 1

## 2018-06-08 MED ORDER — NALBUPHINE HCL 10 MG/ML IJ SOLN: 5.0000 mg | Freq: Once | INTRAMUSCULAR | Status: DC | PRN

## 2018-06-08 MED ORDER — SODIUM CHLORIDE 0.9% FLUSH: 3.0000 mL | INTRAVENOUS | Status: DC | PRN

## 2018-06-08 MED ORDER — CHLOROPROCAINE HCL (PF) 3 % IJ SOLN
INTRAMUSCULAR | Status: AC
  Filled 2018-06-08: qty 20

## 2018-06-08 MED ORDER — DIBUCAINE 1 % RE OINT: 1.0000 "application " | TOPICAL_OINTMENT | RECTAL | Status: DC | PRN

## 2018-06-08 MED ORDER — DEXAMETHASONE SODIUM PHOSPHATE 4 MG/ML IJ SOLN
INTRAMUSCULAR | Status: DC | PRN
  Administered 2018-06-08: 4 mg via INTRAVENOUS

## 2018-06-08 MED ORDER — DEXAMETHASONE SODIUM PHOSPHATE 4 MG/ML IJ SOLN
INTRAMUSCULAR | Status: AC
  Filled 2018-06-08: qty 1

## 2018-06-08 MED ORDER — OXYCODONE HCL 5 MG PO TABS
10.0000 mg | ORAL_TABLET | ORAL | Status: DC | PRN
  Administered 2018-06-09 – 2018-06-10 (×3): 10 mg via ORAL
  Filled 2018-06-08 (×3): qty 2

## 2018-06-08 MED ORDER — PROMETHAZINE HCL 25 MG/ML IJ SOLN: 6.2500 mg | INTRAMUSCULAR | Status: DC | PRN

## 2018-06-08 MED ORDER — ONDANSETRON HCL 4 MG/2ML IJ SOLN
INTRAMUSCULAR | Status: AC
  Filled 2018-06-08: qty 2

## 2018-06-08 MED ORDER — ZOLPIDEM TARTRATE 5 MG PO TABS: 5.0000 mg | ORAL_TABLET | Freq: Every evening | ORAL | Status: DC | PRN

## 2018-06-08 MED ORDER — ACETAMINOPHEN 500 MG PO TABS
1000.0000 mg | ORAL_TABLET | Freq: Four times a day (QID) | ORAL | Status: AC
  Administered 2018-06-09 (×2): 1000 mg via ORAL
  Filled 2018-06-08 (×2): qty 2

## 2018-06-08 MED ORDER — OXYTOCIN 10 UNIT/ML IJ SOLN
INTRAVENOUS | Status: DC | PRN
  Administered 2018-06-08: 40 [IU] via INTRAVENOUS

## 2018-06-08 MED ORDER — MEDROXYPROGESTERONE ACETATE 150 MG/ML IM SUSP: 150.0000 mg | INTRAMUSCULAR | Status: DC | PRN

## 2018-06-08 MED ORDER — SIMETHICONE 80 MG PO CHEW: 80.0000 mg | CHEWABLE_TABLET | ORAL | Status: DC | PRN

## 2018-06-08 MED ORDER — NALBUPHINE HCL 10 MG/ML IJ SOLN: 5.0000 mg | INTRAMUSCULAR | Status: DC | PRN

## 2018-06-08 MED ORDER — MORPHINE SULFATE (PF) 0.5 MG/ML IJ SOLN
INTRAMUSCULAR | Status: AC
  Filled 2018-06-08: qty 10

## 2018-06-08 MED ORDER — LACTATED RINGERS IV SOLN
INTRAVENOUS | Status: DC
  Administered 2018-06-08 – 2018-06-09 (×2): via INTRAVENOUS

## 2018-06-08 MED ORDER — KETOROLAC TROMETHAMINE 30 MG/ML IJ SOLN
INTRAMUSCULAR | Status: AC
  Administered 2018-06-08: 30 mg
  Filled 2018-06-08: qty 1

## 2018-06-08 MED ORDER — BUPIVACAINE HCL (PF) 0.25 % IJ SOLN
INTRAMUSCULAR | Status: DC | PRN
  Administered 2018-06-08: 200 mL

## 2018-06-08 MED ORDER — MORPHINE SULFATE (PF) 0.5 MG/ML IJ SOLN
INTRAMUSCULAR | Status: DC | PRN
  Administered 2018-06-08: .15 mg via INTRATHECAL

## 2018-06-08 MED ORDER — SIMETHICONE 80 MG PO CHEW
80.0000 mg | CHEWABLE_TABLET | Freq: Three times a day (TID) | ORAL | Status: DC
  Administered 2018-06-08 – 2018-06-11 (×8): 80 mg via ORAL
  Filled 2018-06-08 (×8): qty 1

## 2018-06-08 MED ORDER — LACTATED RINGERS IV SOLN
INTRAVENOUS | Status: DC
  Administered 2018-06-08: 06:00:00 via INTRAVENOUS

## 2018-06-08 MED ORDER — SENNOSIDES-DOCUSATE SODIUM 8.6-50 MG PO TABS
2.0000 | ORAL_TABLET | ORAL | Status: DC
  Administered 2018-06-09 – 2018-06-10 (×3): 2 via ORAL
  Filled 2018-06-08 (×3): qty 2

## 2018-06-08 MED ORDER — DIPHENHYDRAMINE HCL 25 MG PO CAPS: 25.0000 mg | ORAL_CAPSULE | ORAL | Status: DC | PRN

## 2018-06-08 MED ORDER — FENTANYL CITRATE (PF) 100 MCG/2ML IJ SOLN
INTRAMUSCULAR | Status: DC | PRN
  Administered 2018-06-08: 15 ug via INTRATHECAL

## 2018-06-08 MED ORDER — CEFAZOLIN SODIUM-DEXTROSE 2-4 GM/100ML-% IV SOLN
2.0000 g | INTRAVENOUS | Status: AC
  Administered 2018-06-08: 2 g via INTRAVENOUS
  Filled 2018-06-08: qty 100

## 2018-06-08 MED ORDER — BUPIVACAINE IN DEXTROSE 0.75-8.25 % IT SOLN
INTRATHECAL | Status: DC | PRN
  Administered 2018-06-08: 1.7 mL via INTRATHECAL

## 2018-06-08 MED ORDER — BUPIVACAINE IN DEXTROSE 0.75-8.25 % IT SOLN
INTRATHECAL | Status: AC
  Filled 2018-06-08: qty 2

## 2018-06-08 MED ORDER — OXYCODONE HCL 5 MG PO TABS
5.0000 mg | ORAL_TABLET | ORAL | Status: DC | PRN
  Administered 2018-06-08 – 2018-06-11 (×4): 5 mg via ORAL
  Filled 2018-06-08 (×4): qty 1

## 2018-06-08 MED ORDER — PHENYLEPHRINE 8 MG IN D5W 100 ML (0.08MG/ML) PREMIX OPTIME
INJECTION | INTRAVENOUS | Status: AC
  Filled 2018-06-08: qty 100

## 2018-06-08 MED ORDER — DIPHENHYDRAMINE HCL 25 MG PO CAPS: 25.0000 mg | ORAL_CAPSULE | Freq: Four times a day (QID) | ORAL | Status: DC | PRN

## 2018-06-08 MED ORDER — MENTHOL 3 MG MT LOZG: 1.0000 | LOZENGE | OROMUCOSAL | Status: DC | PRN

## 2018-06-08 MED ORDER — WITCH HAZEL-GLYCERIN EX PADS: 1.0000 "application " | MEDICATED_PAD | CUTANEOUS | Status: DC | PRN

## 2018-06-08 MED ORDER — KETOROLAC TROMETHAMINE 30 MG/ML IJ SOLN: 30.0000 mg | Freq: Once | INTRAMUSCULAR | Status: DC | PRN

## 2018-06-08 MED ORDER — ONDANSETRON HCL 4 MG/2ML IJ SOLN
INTRAMUSCULAR | Status: DC | PRN
  Administered 2018-06-08: 4 mg via INTRAVENOUS

## 2018-06-08 MED ORDER — LACTATED RINGERS IV BOLUS
1000.0000 mL | Freq: Once | INTRAVENOUS | Status: AC
  Administered 2018-06-08: 1000 mL via INTRAVENOUS

## 2018-06-08 MED ORDER — TETANUS-DIPHTH-ACELL PERTUSSIS 5-2.5-18.5 LF-MCG/0.5 IM SUSP: 0.5000 mL | Freq: Once | INTRAMUSCULAR | Status: DC

## 2018-06-08 MED ORDER — PHENYLEPHRINE 8 MG IN D5W 100 ML (0.08MG/ML) PREMIX OPTIME
INJECTION | INTRAVENOUS | Status: DC | PRN
  Administered 2018-06-08: 30 ug/min via INTRAVENOUS

## 2018-06-08 MED ORDER — COCONUT OIL OIL
1.0000 "application " | TOPICAL_OIL | Status: DC | PRN
  Administered 2018-06-10: 1 via TOPICAL
  Filled 2018-06-08 (×2): qty 120

## 2018-06-08 SURGICAL SUPPLY — 39 items
BENZOIN TINCTURE PRP APPL 2/3 (GAUZE/BANDAGES/DRESSINGS) ×3 IMPLANT
BOOTIES KNEE HIGH SLOAN (MISCELLANEOUS) ×6 IMPLANT
CHLORAPREP W/TINT 26ML (MISCELLANEOUS) ×3 IMPLANT
CLAMP CORD UMBIL (MISCELLANEOUS) IMPLANT
CLOSURE WOUND 1/2 X4 (GAUZE/BANDAGES/DRESSINGS) ×1
CLOTH BEACON ORANGE TIMEOUT ST (SAFETY) ×3 IMPLANT
DRAIN JACKSON PRT FLT 10 (DRAIN) IMPLANT
DRSG OPSITE POSTOP 4X10 (GAUZE/BANDAGES/DRESSINGS) ×3 IMPLANT
ELECT REM PT RETURN 9FT ADLT (ELECTROSURGICAL) ×3
ELECTRODE REM PT RTRN 9FT ADLT (ELECTROSURGICAL) ×1 IMPLANT
EVACUATOR SILICONE 100CC (DRAIN) IMPLANT
EXTRACTOR VACUUM M CUP 4 TUBE (SUCTIONS) IMPLANT
EXTRACTOR VACUUM M CUP 4' TUBE (SUCTIONS)
GAUZE SPONGE 4X4 12PLY STRL LF (GAUZE/BANDAGES/DRESSINGS) ×6 IMPLANT
GLOVE BIOGEL PI IND STRL 7.0 (GLOVE) ×2 IMPLANT
GLOVE BIOGEL PI INDICATOR 7.0 (GLOVE) ×4
GLOVE ECLIPSE 6.5 STRL STRAW (GLOVE) ×3 IMPLANT
GOWN STRL REUS W/TWL LRG LVL3 (GOWN DISPOSABLE) ×6 IMPLANT
KIT ABG SYR 3ML LUER SLIP (SYRINGE) IMPLANT
NEEDLE HYPO 22GX1.5 SAFETY (NEEDLE) ×3 IMPLANT
NEEDLE HYPO 25X5/8 SAFETYGLIDE (NEEDLE) IMPLANT
NS IRRIG 1000ML POUR BTL (IV SOLUTION) ×6 IMPLANT
PACK C SECTION WH (CUSTOM PROCEDURE TRAY) ×3 IMPLANT
PAD ABD 7.5X8 STRL (GAUZE/BANDAGES/DRESSINGS) ×3 IMPLANT
PAD OB MATERNITY 4.3X12.25 (PERSONAL CARE ITEMS) ×3 IMPLANT
PENCIL SMOKE EVAC W/HOLSTER (ELECTROSURGICAL) ×3 IMPLANT
RTRCTR C-SECT PINK 25CM LRG (MISCELLANEOUS) ×3 IMPLANT
SPONGE LAP 18X18 RF (DISPOSABLE) ×12 IMPLANT
STRIP CLOSURE SKIN 1/2X4 (GAUZE/BANDAGES/DRESSINGS) ×2 IMPLANT
SUT MNCRL AB 3-0 PS2 27 (SUTURE) ×3 IMPLANT
SUT SILK 2 0 FSL 18 (SUTURE) IMPLANT
SUT VIC AB 0 CTX 36 (SUTURE) ×4
SUT VIC AB 0 CTX36XBRD ANBCTRL (SUTURE) ×2 IMPLANT
SUT VIC AB 1 CT1 36 (SUTURE) ×6 IMPLANT
SUT VIC AB 2-0 CT1 27 (SUTURE)
SUT VIC AB 2-0 CT1 TAPERPNT 27 (SUTURE) IMPLANT
SYR 20CC LL (SYRINGE) ×3 IMPLANT
TOWEL OR 17X24 6PK STRL BLUE (TOWEL DISPOSABLE) ×3 IMPLANT
TRAY FOLEY W/BAG SLVR 14FR LF (SET/KITS/TRAYS/PACK) ×3 IMPLANT

## 2018-06-08 NOTE — Lactation Note (Signed)
This note was copied from a baby's chart. Lactation Consultation Note  Patient Name: Carmen Wheeler [id number] Date: 06/08/2018 Reason for consult: Initial assessment;Early term 42-38.6wks  P4 mother whose infant is now 61 hours old.  Mother breast fed her three other children for time frames ranging from 4 months-10 months.  The youngest child is currently 34 years old.  Baby was sleeping in father's arms as I arrived.  Mother has recently fed and she feels like he is latching well.  She denied pain with latching.  Demonstrated hand expression and had mother return demonstrate.  Colostrum flows easily; collected drops in colostrum container.  Milk storage times reviewed.  Demonstrated finger feeding.  Mother's breasts are soft and non tender and nipples are everted bilaterally.  There is no trauma to nipples from previous latch.  Encouraged to feed 8-12 times/24 hours or sooner if baby shows cues.  Reviewed feeding cues.  She will continue to do hand expression before/after feedings.  Mother will call for latch assistance as needed.  Mom made aware of O/P services, breastfeeding support groups, community resources, and our phone # for post-discharge questions. 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  Father and visitors present.   Maternal Data Formula Feeding for Exclusion: No Has patient been taught Hand Expression?: Yes Does the patient have breastfeeding experience prior to this delivery?: Yes  Feeding Feeding Type: Breast Fed  LATCH Score                   Interventions    Lactation Tools Discussed/Used WIC Program: No   Consult Status Consult Status: Follow-up Date: 06/09/18 Follow-up type: In-patient    Makyi Ledo R Reigna Ruperto 06/08/2018, 5:55 PM

## 2018-06-08 NOTE — Op Note (Addendum)
Cesarean Section Procedure Note  Indications: P3 at 62 6/7wks with h/o prior c/s x 3 desiring repeat c-section declining sterilization.  Pre-operative Diagnosis: 1.38 6/7wks 2.Prior Cesarean Section   Post-operative Diagnosis: 1.38 6/7wks 2.Prior Cesarean Section  Procedure: REPEAT CESAREAN SECTION  Surgeon: Osborn Coho, MD    Assistants: Bernerd Pho, CNM  Anesthesia: Regional  Anesthesiologist: Lowella Curb, MD   Procedure Details  The patient was taken to the operating room secondary to h/o prior c-section after the risks, benefits, complications, treatment options, and expected outcomes were discussed with the patient.  The patient concurred with the proposed plan, giving informed consent which was signed and witnessed. The patient was taken to Operating Room 9/C-Section Suite, identified as Carmen Wheeler and the procedure verified as C-Section Delivery. A Time Out was held and the above information confirmed.  After induction of anesthesia by obtaining a spinal, the patient was prepped and draped in the usual sterile manner. A Pfannenstiel skin incision was made and carried down through the subcutaneous tissue to the underlying layer of fascia.  The fascia was incised bilaterally and extended transversely bilaterally with the Mayo scissors. Kocher clamps were placed on the inferior aspect of the fascial incision and the underlying rectus muscle was separated from the fascia. The same was done on the superior aspect of the fascial incision.  The peritoneum was identified, entered bluntly and extended manually.  An Alexis self-retaining retractor was placed.  The utero-vesical peritoneal reflection was incised transversely and the bladder flap was bluntly freed from the lower uterine segment. A low transverse uterine incision was made with the scalpel and extended bilaterally with the bandage scissors.  The infant was delivered in vertex position without difficulty.  After  the umbilical cord was clamped and cut, the infant was handed to the awaiting pediatricians.  Cord blood was obtained for evaluation.  The placenta was removed intact and appeared to be within normal limits. The uterus was cleared of all clots and debris. The uterine incision was closed with running interlocking sutures of 0 Vicryl and a second imbricating layer was performed as well.   There was continued bleeding at the rt angle that was made hemostatic after ligation of the rt uterine vessel.   Good hemostasis was noted.  Copious irrigation was performed until clear.  The peritoneum was repaired with 2-0 chromic via a running suture.  The fascia was reapproximated with a running suture of 0 Vicryl.  The skin was reapproximated with a subcuticular suture of 3-0 monocryl.  Steristrips were applied.  Instrument, sponge, and needle counts were correct prior to abdominal closure and at the conclusion of the case.  The patient was awaiting transfer to the recovery room in good condition.  Findings: Live female infant with Apgars 9 at one minute and 9 at five minutes.    Estimated Blood Loss:  601 ml         Drains: foley to gravity 150cc concentrated urine         Total IV Fluids: 1300 ml         Specimens to Pathology: Placenta         Complications:  None; patient tolerated the procedure well.         Disposition: PACU - hemodynamically stable.         Condition: stable  Attending Attestation: I performed the procedure.

## 2018-06-08 NOTE — Anesthesia Preprocedure Evaluation (Addendum)
Anesthesia Evaluation  Patient identified by MRN, date of birth, ID band Patient awake    Reviewed: Allergy & Precautions, NPO status , Patient's Chart, lab work & pertinent test results  History of Anesthesia Complications Negative for: history of anesthetic complications  Airway Mallampati: II  TM Distance: >3 FB Neck ROM: Full    Dental no notable dental hx. (+) Teeth Intact, Dental Advisory Given   Pulmonary neg pulmonary ROS,    Pulmonary exam normal breath sounds clear to auscultation       Cardiovascular negative cardio ROS Normal cardiovascular exam Rhythm:Regular Rate:Normal     Neuro/Psych negative neurological ROS     GI/Hepatic negative GI ROS, Neg liver ROS,   Endo/Other  negative endocrine ROS  Renal/GU negative Renal ROS     Musculoskeletal negative musculoskeletal ROS (+)   Abdominal   Peds  Hematology  (+) anemia , Hgb 10.7 on 06/06/18   Anesthesia Other Findings Day of surgery medications reviewed with the patient.  Reproductive/Obstetrics (+) Pregnancy Hx of C/S x3                            Anesthesia Physical Anesthesia Plan  ASA: II  Anesthesia Plan: Combined Spinal and Epidural   Post-op Pain Management:    Induction:   PONV Risk Score and Plan: 2 and Treatment may vary due to age or medical condition, Dexamethasone and Ondansetron  Airway Management Planned: Natural Airway  Additional Equipment:   Intra-op Plan:   Post-operative Plan:   Informed Consent: I have reviewed the patients History and Physical, chart, labs and discussed the procedure including the risks, benefits and alternatives for the proposed anesthesia with the patient or authorized representative who has indicated his/her understanding and acceptance.   Dental advisory given  Plan Discussed with: CRNA  Anesthesia Plan Comments:        Anesthesia Quick Evaluation

## 2018-06-08 NOTE — Addendum Note (Signed)
Addendum  created 06/08/18 1739 by Graciela Husbands, CRNA   Intraprocedure LDAs edited, LDA properties accepted, Sign clinical note

## 2018-06-08 NOTE — MAU Provider Note (Signed)
S: Ms. Kimyetta Flott is a 32 y.o. [id number] at [redacted]w[redacted]d  who presents to MAU today for labor evaluation.     Cervical exam by RN:   0/thick/high  On reexam by CNM: Dilation: 1 Effacement (%): 80 Cervical Position: Posterior Exam by:: Misty Stanley L., cnm  Fetal Monitoring: Baseline: 135 Variability: moderate Accelerations: present Decelerations: none Contractions: every 6-8 min  MDM Discussed patient with RN. NST reviewed.   A: Active labor 3 prior C/S  P: Called Dr Su Hilt regarding pt cervical change and discomfort with contractions, recommend admission.  Admit to OR for repeat C/S. [facility] CNM to room to admit pt   Hurshel Party, CNM 06/08/2018 6:27 AM

## 2018-06-08 NOTE — H&P (Signed)
Carmen Wheeler is a 32 y.o. female, [id number] at 38.6 weeks, presenting for contractions. pt is contracting regularly and uncomfortable and with change from closed to 1cm.  Recommends admission for repeat c/s.  Pt initially scheduled for Monday.  Pt has history of 3 previous LTCS.    History of present pregnancy: Patient entered care at 16 weeks.   EDC of 11/4/2019was established by    Anatomy scan:  20 weeks, with normal findings and an posterior placenta.     OB History    Gravida  6   Para  3   Term  3   Preterm      AB  2   Living  3     SAB      TAB  2   Ectopic      Multiple      Live Births  3          Past Medical History:  Diagnosis Date  . Anemia    Past Surgical History:  Procedure Laterality Date  . CESAREAN SECTION    . WISDOM TOOTH EXTRACTION     Family History: family history is not on file. Social History:  reports that she has never smoked. She has never used smokeless tobacco. She reports that she does not drink alcohol or use drugs.   Prenatal Transfer Tool  Maternal Diabetes: No Genetic Screening: Normal Maternal Ultrasounds/Referrals: Normal Fetal Ultrasounds or other Referrals:  None Maternal Substance Abuse:  No Significant Maternal Medications:  None Significant Maternal Lab Results: None  ROS:  All 10 systems evaluated and negative except as stated above.  No Known Allergies   Dilation: 1 Effacement (%): 80 Exam by:: Lisa L., cnm Blood pressure 113/73, pulse 98, temperature 98.4 F (36.9 C), temperature source Oral, resp. rate 19, height 4\' 11"  (1.499 m), weight 65.8 kg, last menstrual period 09/09/2017.  Chest clear Heart RRR without murmur Abd gravid, NT, FH appropriate Pelvic: 1/80/-1 Ext: Neg  FHR: Category 1 UCs:  Every 3  Prenatal labs: ABO, Rh: --/--/A POS (10/27 0520) Antibody: NEG (10/27 0520) Rubella:  Immune (04/01 0000) RPR: Non Reactive (10/25 0957)  HBsAg: Negative (04/01 0000)  HIV:  Non-reactive (04/01 0000)  GBS:   Sickle cell/Hgb electrophoresis:  AA Pap:  2017 GC:  Neg Chlamydia:  NEg Genetic screenings:  Neg Glucola:  115 Other:   Hgb  9.9 at 28 weeks       Assessment/Plan: IUP at 38.4 IUP in active labor  Plan: Proceed with LTCS repeat Dr. Su Hilt aware Henderson Newcomer ProtheroCNM, MSN 06/08/2018, 7:59 AM

## 2018-06-08 NOTE — MAU Provider Note (Signed)
Carmen Wheeler is a 32 y.o. female presenting for labor eval.  Called by midlevel in MAU who reports pt is contracting regularly and uncomfortable and with change from closed to 1cm.  Recommends admission for repeat c/s.  Pt initially scheduled for Monday.  OB History    Gravida  6   Para  3   Term  3   Preterm      AB  2   Living  3     SAB      TAB  2   Ectopic      Multiple      Live Births  3          Past Medical History:  Diagnosis Date  . Anemia    Past Surgical History:  Procedure Laterality Date  . CESAREAN SECTION    . WISDOM TOOTH EXTRACTION     Family History: family history is not on file. Social History:  reports that she has never smoked. She has never used smokeless tobacco. She reports that she does not drink alcohol or use drugs.     Maternal Diabetes: No Genetic Screening: Normal Maternal Ultrasounds/Referrals: Normal Fetal Ultrasounds or other Referrals:  None Maternal Substance Abuse:  No Significant Maternal Medications:  Meds include: Other: PNV and iron Significant Maternal Lab Results:  Lab values include: Group B Strep positive Other Comments:  None  ROS  Denies F/C/N/V/D History Dilation: 1 Effacement (%): 80 Exam by:: Lisa L., cnm Blood pressure 113/73, pulse 98, temperature 98.4 F (36.9 C), temperature source Oral, resp. rate 19, height 4\' 11"  (1.499 m), weight 65.8 kg, last menstrual period 09/09/2017. Exam Physical Exam  Lungs CTA CV RRR Gravid NT Ext no calf tenderness Prenatal labs: ABO, Rh: --/--/A POS (10/27 0520) Antibody: NEG (10/27 0520) Rubella: Immune (04/01 0000) RPR: Non Reactive (10/25 0957)  HBsAg: Negative (04/01 0000)  HIV: Non-reactive (04/01 0000)  GBS:   positive  Assessment/Plan: 32yo P3 with h/o prior c/s x 3 desiring repeat c-section.  R/B/A discussed.  Pt did not have any questions.  She is ready and wanting to proceed.  She declines sterilization.   Purcell Nails 06/08/2018,  6:42 AM

## 2018-06-08 NOTE — Anesthesia Postprocedure Evaluation (Signed)
Anesthesia Post Note  Patient: Carmen Wheeler  Procedure(s) Performed: REPEAT CESAREAN SECTION (N/A )     Patient location during evaluation: PACU Anesthesia Type: General Level of consciousness: awake and alert Pain management: pain level controlled Vital Signs Assessment: post-procedure vital signs reviewed and stable Respiratory status: spontaneous breathing, nonlabored ventilation and respiratory function stable Cardiovascular status: blood pressure returned to baseline and stable Postop Assessment: no apparent nausea or vomiting Anesthetic complications: no    Last Vitals:  Vitals:   06/08/18 0913 06/08/18 0916  BP: 111/72 104/60  Pulse:    Resp: (!) 25 18  Temp: 37.2 C   SpO2:      Last Pain:  Vitals:   06/08/18 0913  TempSrc: Oral  PainSc: 3    Pain Goal:                 Lowella Curb

## 2018-06-08 NOTE — MAU Note (Signed)
Contractions all day yesterday, now every apart. Denies bleeding or leaking of fluid. Previous  C/s X 3. Scheduled for c/s tomorrow

## 2018-06-08 NOTE — Anesthesia Postprocedure Evaluation (Signed)
Anesthesia Post Note  Patient: Ramyah Pankowski  Procedure(s) Performed: REPEAT CESAREAN SECTION (N/A )     Patient location during evaluation: Mother Baby Anesthesia Type: Spinal and Epidural Level of consciousness: awake and alert and oriented Pain management: satisfactory to patient Vital Signs Assessment: post-procedure vital signs reviewed and stable Respiratory status: respiratory function stable and spontaneous breathing Cardiovascular status: blood pressure returned to baseline Postop Assessment: no headache, no backache, spinal receding, patient able to bend at knees and adequate PO intake Anesthetic complications: no    Last Vitals:  Vitals:   06/08/18 1235 06/08/18 1635  BP: (!) 97/54 (!) 95/56  Pulse: 79 74  Resp: 16 15  Temp: 36.7 C (!) 36.3 C  SpO2: 98% 99%    Last Pain:  Vitals:   06/08/18 1635  TempSrc: Oral  PainSc: 0-No pain   Pain Goal:                 Mykalah Saari

## 2018-06-08 NOTE — Transfer of Care (Signed)
Immediate Anesthesia Transfer of Care Note  Patient: Carmen Wheeler  Procedure(s) Performed: REPEAT CESAREAN SECTION (N/A )  Patient Location: PACU  Anesthesia Type:Spinal  Level of Consciousness: awake, alert  and oriented  Airway & Oxygen Therapy: Patient Spontanous Breathing  Post-op Assessment: Report given to RN and Post -op Vital signs reviewed and stable  Post vital signs: Reviewed and stable  Last Vitals:  Vitals Value Taken Time  BP 90/78 06/08/2018  8:23 AM  Temp    Pulse 97 06/08/2018  8:25 AM  Resp 19 06/08/2018  8:24 AM  SpO2 94 % 06/08/2018  8:25 AM  Vitals shown include unvalidated device data.  Last Pain:  Vitals:   06/08/18 0610  TempSrc:   PainSc: Asleep         Complications: No apparent anesthesia complications

## 2018-06-08 NOTE — MAU Note (Signed)
Urine in lab 

## 2018-06-08 NOTE — Anesthesia Procedure Notes (Signed)
Combined spinal epidural  Patient location during procedure: OR Start time: 06/08/2018 6:43 AM End time: 06/08/2018 6:47 AM Staffing Anesthesiologist: Kaylyn Layer, MD Performed: anesthesiologist  Preanesthetic Checklist Completed: patient identified, site marked, pre-op evaluation, timeout performed, IV checked, risks and benefits discussed and monitors and equipment checked Spinal Block Patient position: sitting Prep: ChloraPrep Patient monitoring: heart rate, cardiac monitor and continuous pulse ox Approach: midline Location: L3-4 Injection technique: single-shot Needle Needle type: Pencan  Needle gauge: 25 G Needle length: 12.7 cm Catheter at skin depth: 11 cm Additional Notes Risks, benefits, and alternative discussed. Patient gave consent to procedure. Prepped and draped in sitting position. Tuohy needle inserted to LOR to air at 6cm. Needle in needle technique for intrathecal injection. Clear CSF obtained. Positive terminal aspiration. Spinal needle removed and epidural catheter threaded to 11cm. No pain or paraesthesias with injection. Patient tolerated procedure well. Vital signs stable. Amalia Greenhouse, MD

## 2018-06-09 LAB — CBC
HEMATOCRIT: 23 % — AB (ref 36.0–46.0)
Hemoglobin: 8.2 g/dL — ABNORMAL LOW (ref 12.0–15.0)
MCH: 31.7 pg (ref 26.0–34.0)
MCHC: 35.7 g/dL (ref 30.0–36.0)
MCV: 88.8 fL (ref 80.0–100.0)
Platelets: 160 10*3/uL (ref 150–400)
RBC: 2.59 MIL/uL — ABNORMAL LOW (ref 3.87–5.11)
RDW: 14.8 % (ref 11.5–15.5)
WBC: 17.3 10*3/uL — ABNORMAL HIGH (ref 4.0–10.5)
nRBC: 0 % (ref 0.0–0.2)

## 2018-06-09 MED ORDER — MEDROXYPROGESTERONE ACETATE 150 MG/ML IM SUSP
150.0000 mg | Freq: Once | INTRAMUSCULAR | Status: DC
  Filled 2018-06-09: qty 1

## 2018-06-09 NOTE — Progress Notes (Signed)
Carmen Wheeler [id number] Postpartum Day 1 S/P repeat Cesarean Section   Subjective: Patient up ad lib, denies syncope or dizziness. Reports consuming regular diet without issues and denies N/V. Patient reports no bowel movement + passing flatus.  Denies issues with urination and reports bleeding is "light."  Patient is breastfeeding and reports going well.  Desires depo for postpartum contraception.  Pain is being appropriately managed with use of PO pain meds.   Objective: Temp:  [97.3 F (36.3 C)-98.9 F (37.2 C)] 98.1 F (36.7 C) (10/28 0426) Pulse Rate:  [67-103] 81 (10/28 0426) Resp:  [13-25] 16 (10/28 0426) BP: (95-116)/(51-72) 97/64 (10/28 0426) SpO2:  [98 %-100 %] 100 % (10/28 0426)  Recent Labs    06/06/18 0957 06/09/18 0544  HGB 10.7* 8.2*  HCT 31.4* 23.0*  WBC 11.9* 17.3*    Physical Exam:  General: alert and no distress Mood/Affect: having some pain, fatigued Incision:  pressure dsg, CDI  Uterine Fundus: firm Lochia: appropriate Skin: Warm, Dry. DVT Evaluation: No evidence of DVT seen on physical exam.   Assessment Post Operative Day 1 S/P repeat C/S normal Involution breastFeeding Hemodynamically Stable Anemia of pregnancy  Plan: Continue Fe, orthostatic BP -Continue current care -Anticipate d/c 10/30 -Depo on D/C -Dr. Estanislado Pandy updated on patient status  Carmen Bridgeman MSN, CNM 06/09/2018, 8:33 AM

## 2018-06-09 NOTE — Lactation Note (Signed)
This note was copied from a baby's chart. Lactation Consultation Note  Patient Name: Carmen Wheeler [id number] Date: 06/09/2018 Reason for consult: Follow-up assessment;Early term 31-38.6wks  P4 mother whose infant is now 68 hours old.  Mother breast fed her other three children from 4 months-10 months.  Baby was swaddled in a receiving blanket and another big fleece blanket when I arrived.  He was at the breast and had a shallow latch.  I offered to assist with latching and mother accepted help.  Assisted baby to latch onto the left breast in the cross cradle hold without difficulty.  Mother's breasts are soft and non tender and nipples are everted.  Baby began rhythmic sucking with flanged lips immediately.  Mother denied pain with latched.  Demonstrated breast compressions and how to maintain a deep latch.  Reinforced the importance of a good deep latch.  Mother verbalized understanding.  She will continue to feed 8-12 times/24 hours or sooner if he shows feeding cues.  She will call for latch assistance as needed.  RN updated.   Maternal Data Formula Feeding for Exclusion: No Has patient been taught Hand Expression?: Yes Does the patient have breastfeeding experience prior to this delivery?: Yes  Feeding Feeding Type: Breast Fed  LATCH Score Latch: Grasps breast easily, tongue down, lips flanged, rhythmical sucking.  Audible Swallowing: A few with stimulation  Type of Nipple: Everted at rest and after stimulation  Comfort (Breast/Nipple): Soft / non-tender  Hold (Positioning): Assistance needed to correctly position infant at breast and maintain latch.  LATCH Score: 8  Interventions Interventions: Breast feeding basics reviewed;Assisted with latch;Skin to skin;Breast massage;Support pillows;Adjust position;Breast compression;Position options  Lactation Tools Discussed/Used     Consult Status Consult Status: Follow-up Date: 06/10/18 Follow-up type:  In-patient    Carmen Wheeler 06/09/2018, 10:06 AM

## 2018-06-09 NOTE — Lactation Note (Signed)
This note was copied from a baby's chart. Lactation Consultation Note Spoke w/mom regarding 6 % weight loss in less than 24 hrs. Baby has had only 2 stools and several voids.  LC concern about transfer. Mom has great everted nipples. Denies painful latches. States baby is cluster feeding. Breast are filling. Hand expressed transitional milk. Milk squirted. Discussed engorgement prevention, breast massage, pumping and storing milk. Encouraged mom to assess breast before and after feeding for transfer.  Baby has high palate, labial frenulum, probable posterior tight frenulum. Tongue does extend slightly past bottom gum. Baby looks jaundice. Reported information to RN.  Will set mom up w/DEBP, give baby milk back via curve tip syring. Mom stated she didn't pump w/her other children who are now older. Mom did say she remembers having engorgement issues. No latch score recently. Asked mom to call for [facility] for feeding assessment.  Patient Name: Carmen Wheeler [id number] Date: 06/09/2018 Reason for consult: Follow-up assessment;Infant weight loss   Maternal Data    Feeding Feeding Type: Breast Fed  LATCH Score       Type of Nipple: Everted at rest and after stimulation  Comfort (Breast/Nipple): Soft / non-tender        Interventions Interventions: Expressed milk;Breast massage;Hand express  Lactation Tools Discussed/Used     Consult Status Consult Status: Follow-up Date: 06/09/18 Follow-up type: In-patient    [name] 06/09/2018, 9:48 PM

## 2018-06-10 NOTE — Lactation Note (Addendum)
This note was copied from a baby's chart. Lactation Consultation Note  Patient Name: Carmen Wheeler [id number] Date: 06/10/2018 Reason for consult: Follow-up assessment;Early term 37-38.6wks;Infant weight loss  52 hours old early tem female who is being exclusively BF by his mother, she's a P4. Mom BF all her other children and she wasn't concern about 7% weight loss until [facility] discussed with mom the potential risks of poor transfer. Mom's breast starting getting full this morning, she was trying to latch baby on when entering the room, LC offered assistance with latch, baby easily latched on to the right breast in cross cradle position but he fell asleep at the 5 minutes mark. Audible swallows were heard upon breast compressions. After infant was asleep, LC did some hand expression with mom and colostrum was just pouring off her breast.  Discussed early term behavior, mom not concerned with any issues on baby's mouth, but LC noticed that infant is not emptying the breast. Mom's nipples look intact upon examination with no pain or discomfort at all. Infant is transferring some but not emptying the breast. Explained to mom how this could be tied to his weight loss and she voiced understanding and agreed to start a DEBP. Pump instructions, cleaning and storage were reviewed as well as milk storage guidelines. Mom was a little hesitant to start pumping due to fear of nipple pain. Asked her RN Tamela Oddi for coconut oil and instructed mom how to used it. She is still leaning more towards so the instrument for supplementation with mother's EBM will be a curved tip syringe. Cluster feeding was discussed, baby cluster fed last night.  Feeding plan:  1.  Mom will continue feeding baby STS 8-12 times/day or sooner if feeding cues are present 2. She will start double pumping after feedings and will use coconut oil for lubrication prior pumping 3. She'll syringe feed baby any amount of EBM she may get  Mom  reported all questions and concerns were answered, she's aware of LC services and will call PRN.  Maternal Data Has patient been taught Hand Expression?: Yes Does the patient have breastfeeding experience prior to this delivery?: Yes  Feeding Feeding Type: Breast Fed  LATCH Score Latch: Grasps breast easily, tongue down, lips flanged, rhythmical sucking.  Audible Swallowing: A few with stimulation  Type of Nipple: Everted at rest and after stimulation  Comfort (Breast/Nipple): Soft / non-tender  Hold (Positioning): No assistance needed to correctly position infant at breast.(minimal assistance needed)  LATCH Score: 9  Interventions Interventions: Breast feeding basics reviewed;Assisted with latch;Skin to skin;Breast massage;Hand express;Breast compression;Support pillows;Coconut oil;DEBP;Adjust position  Lactation Tools Discussed/Used Tools: Pump;Coconut oil Breast pump type: Double-Electric Breast Pump Pump Review: Setup, frequency, and cleaning;Milk Storage Initiated by:: MPeck Date initiated:: 06/10/18   Consult Status Consult Status: Follow-up Date: 06/11/18 Follow-up type: In-patient    Rawlin Reaume Venetia Constable 06/10/2018, 11:48 AM

## 2018-06-10 NOTE — Progress Notes (Signed)
Dressing change with instructor using sterile technique.  Patient tolerated well.

## 2018-06-10 NOTE — Progress Notes (Addendum)
Carmen Wheeler [id number]  Subjective: Postpartum Day 2: Repeat C/S  Patient up ad lib, reports no syncope or dizziness. Feeding:  breast Contraceptive plan:  Depo Incisional pain improved with PO meds. Breastfeeding going well.  Objective: Temp:  [98 F (36.7 C)-98.4 F (36.9 C)] 98.4 F (36.9 C) (10/29 0539) Pulse Rate:  [76-89] 78 (10/29 0539) Resp:  [18-20] 18 (10/29 0539) BP: (96-106)/(59-67) 96/67 (10/29 0539) SpO2:  [99 %-100 %] 100 % (10/29 0539)  CBC Latest Ref Rng & Units 06/09/2018 06/06/2018 08/25/2017  WBC 4.0 - 10.5 K/uL 17.3(H) 11.9(H) 6.6  Hemoglobin 12.0 - 15.0 g/dL 8.2(L) 10.7(L) 12.7  Hematocrit 36.0 - 46.0 % 23.0(L) 31.4(L) 38.0  Platelets 150 - 400 K/uL 160 173 173     Physical Exam:  General: alert Lochia: appropriate Uterine Fundus: firm Abdomen:  + bowel sounds Incision: Honeycomb dressing CDI DVT Evaluation: No evidence of DVT seen on physical exam.  Assessment/Plan: Status post cesarean delivery, day 2. Asymptomatic anemia of pregnancy Stable Continue current care. Plan for discharge tomorrow  Depo at dischardge    Nigel Bridgeman MSN, CNM 06/10/2018, 7:16 AM Attestation of Attending Supervision of Advanced Practitioner (CNM/NP): Evaluation and management procedures were performed by the Advanced Practitioner under my supervision and collaboration.  I have reviewed the Advanced Practitioner's note and chart, and I agree with the management and plan. I saw and examined patient at bedside and agree with above findings, assessment and plan as outlined above by CNM Nigel Bridgeman.  Patient will need iron tablets at time of discharge.    06/10/2018 1233pm.

## 2018-06-11 ENCOUNTER — Encounter (HOSPITAL_COMMUNITY): Payer: Self-pay | Admitting: *Deleted

## 2018-06-11 MED ORDER — IBUPROFEN 600 MG PO TABS: 600.0000 mg | ORAL_TABLET | Freq: Four times a day (QID) | ORAL | 0 refills | Status: AC

## 2018-06-11 MED ORDER — MEDROXYPROGESTERONE ACETATE 150 MG/ML IM SUSP
150.0000 mg | Freq: Once | INTRAMUSCULAR | Status: AC
  Administered 2018-06-11: 150 mg via INTRAMUSCULAR

## 2018-06-11 MED ORDER — OXYCODONE HCL 5 MG PO TABS: 5.0000 mg | ORAL_TABLET | ORAL | 0 refills | Status: DC | PRN

## 2018-06-11 MED ORDER — DOCUSATE SODIUM 100 MG PO CAPS: 100.0000 mg | ORAL_CAPSULE | Freq: Two times a day (BID) | ORAL | 2 refills | Status: AC

## 2018-06-11 MED ORDER — FEROSUL 325 (65 FE) MG PO TABS: 325.0000 mg | ORAL_TABLET | Freq: Two times a day (BID) | ORAL | 2 refills | Status: DC

## 2018-06-11 NOTE — Lactation Note (Addendum)
This note was copied from a baby's chart. Lactation Consultation Note  Patient Name: Carmen Wheeler [id number] Date: 06/11/2018 Reason for consult: Follow-up assessment;Infant weight loss;Early term 37-38.6wks(LC rechecked mom for resolving engorgement - per mom feels much better)  2nd LC visit  Per mom the right is better after the baby fed for 15 mins. And icing.  Mom asked to have her breast checked, the knots on the lateral right side are resolved.  LC reminded mom to call [facility] services or [facility] if challenges with over fullness/  Or challenges with engorgement.  Mother informed of post-discharge support and given phone number to the lactation department, including services for phone call assistance; out-patient appointments; and breastfeeding support group. List of other breastfeeding resources in the community given in the handout. Encouraged mother to call for problems or concerns related to breastfeeding.   Maternal Data     Interventions: Breast feeding basics reviewed  Lactation Tools Discussed/Used Tools: Pump Breast pump type: Double-Electric Breast Pump;Manual WIC Program: Yes(per mom WIC saw her yesterday ) Pump Review: Milk Storage;Setup, frequency, and cleaning Initiated by:: reviewed / and instructed mom on the use hand pump - see LC note  Date initiated:: 06/11/18   Consult Status Consult Status: Complete Date: 06/11/18    Kathrin Greathouse 06/11/2018, 12:29 PM

## 2018-06-11 NOTE — Discharge Summary (Signed)
OB Discharge Summary     Patient Name: Carmen Wheeler DOB: [birth year] MRN: [id number]  Date of admission: 06/08/2018 Delivering MD: Osborn Coho   Date of discharge: 06/11/2018  Admitting diagnosis: 38wks ctxs 12 mins apart  Intrauterine pregnancy: [redacted]w[redacted]d     Secondary diagnosis:  Active Problems:   Status post repeat low transverse cesarean section  Additional problems: None     Discharge diagnosis: Term Pregnancy Delivered                                                                                                Post partum procedures:None  Augmentation: N/A  Complications: None  Hospital course:  Sceduled C/S   32 y.o. yo [id number] at [redacted]w[redacted]d was admitted to the hospital 06/08/2018 for scheduled cesarean section with the following indication:Elective Repeat.  Membrane Rupture Time/Date: 7:23 AM ,06/08/2018   Patient delivered a Viable infant.06/08/2018  Details of operation can be found in separate operative note.  Pateint had an uncomplicated postpartum course.  She is ambulating, tolerating a regular diet, passing flatus, and urinating well. Patient is discharged home in stable condition on  06/11/18         Physical exam  Vitals:   06/10/18 1100 06/10/18 1515 06/10/18 2131 06/11/18 0526  BP: (!) 98/59 102/62 (!) 99/50 112/71  Pulse: 82 86 79 82  Resp: 18 17 18 18   Temp: 98.1 F (36.7 C) 98 F (36.7 C) 98 F (36.7 C) 98 F (36.7 C)  TempSrc: Oral Oral Oral   SpO2: 100% 99%  99%  Weight:      Height:       General: alert, cooperative and no distress Lochia: appropriate Uterine Fundus: firm Incision: Dressing is clean, dry, and intact DVT Evaluation: No evidence of DVT seen on physical exam. Labs: Lab Results  Component Value Date   WBC 17.3 (H) 06/09/2018   HGB 8.2 (L) 06/09/2018   HCT 23.0 (L) 06/09/2018   MCV 88.8 06/09/2018   PLT 160 06/09/2018   CMP Latest Ref Rng & Units 08/25/2017  Glucose 65 - 99 mg/dL 94  BUN 6 - 20 mg/dL 7   Creatinine 1.47 - 8.29 mg/dL 5.62  Sodium 130 - 865 mmol/L 139  Potassium 3.5 - 5.1 mmol/L 3.4(L)  Chloride 101 - 111 mmol/L 106  CO2 22 - 32 mmol/L 25  Calcium 8.9 - 10.3 mg/dL 7.8(I)  Total Protein 6.5 - 8.1 g/dL 6.9(G)  Total Bilirubin 0.3 - 1.2 mg/dL 0.4  Alkaline Phos 38 - 126 U/L 48  AST 15 - 41 U/L 20  ALT 14 - 54 U/L 17    Discharge instruction: per After Visit Summary and "Baby and Me Booklet".  After visit meds:  Motrin, Oxycodone, FeSo4, PNV, Colace  Diet: routine diet  Activity: Advance as tolerated. Pelvic rest for 6 weeks.   Outpatient follow up:6 weeks Follow up Appt:No future appointments. Follow up Visit:No follow-ups on file.  Postpartum contraception: Depo Provera  Newborn Data: Live born female  Birth Weight: 7 lb 3 oz (3260 g) APGAR: 9, 9  Newborn Delivery   Birth  date/time:  06/08/2018 07:23:00 Delivery type:  C-Section, Low Transverse Trial of labor:  No C-section categorization:  Repeat     Baby Feeding: Breast Disposition:home with mother   06/11/2018 Kenney Houseman, CNM

## 2018-06-11 NOTE — Lactation Note (Signed)
This note was copied from a baby's chart. Lactation Consultation Note  Patient Name: Carmen Wheeler Date: 06/11/2018 Reason for consult: Follow-up assessment;Infant weight loss;Early term 37-38.6wks;Other (Comment)(4% weight loss/ milk in / lateral aspect right boarder line engorged / see LC note )  Baby id 74 hours old , @ 26 hours old - 5.2 4% weight loss  Per mom baby last fed at 7:55 am for 15 mins. Presently sound asleep.  Mom mentioned her milk is in and the right side is alittle engorged.  LC offered to assess and mom receptive, left breast , slightly warm, no engorgement,  Right breast slightly warm, and filled milk ducts on the lateral aspect. Mom denies sore nipples , and nipples clear. LC provided ice pack for the right and recommended to mom to  Ice for 15 -20 mins , release to comfort, and baby will be due to eat 1100 am .  LC reviewed sore nipple and engorgement prevention and tx. Mom has the DEBP set up.  Per mom saw [facility] yesterday and obtained a Formula packet for her WIC , therefore is not able to obtain DEBP from [facility]. LC instructed mom on the use of hand pump.  Mom mentioned the #24 F has been comfortable.  LC discussed nutritive vs non - nutritive feeding patterns and watching for hanging out latched. Importance of feeding STS until the baby can stay awake for a feeding,back to birth weight. Feed 8-12 times in 24 hours and not to go over 24 hours.  LC reported to [facility] about ice provided for engorgement, and the above discussion.   LC recommended to mom if when she get home the hand pump isn't working for relieve of breast to call WIC and change her [facility] packet to breast feeding so she can get a DEBP .  Mother informed of post-discharge support and given phone number to the lactation department, including services for phone call assistance; out-patient appointments; and breastfeeding support group. List of other breastfeeding resources in the community  given in the handout. Encouraged mother to call for problems or concerns related to breastfeeding.   Maternal Data    Feeding Feeding Type: (per mom last fed at 0755 )  LATCH Score                   Interventions Interventions: Breast feeding basics reviewed  Lactation Tools Discussed/Used Tools: Pump Breast pump type: Double-Electric Breast Pump;Manual WIC Program: Yes(per mom WIC saw her yesterday ) Pump Review: Milk Storage;Setup, frequency, and cleaning Initiated by:: reviewed / and instructed mom on the use hand pump - see LC note  Date initiated:: 06/11/18   Consult Status Consult Status: Complete Date: 06/11/18    Carmen Wheeler 06/11/2018, 9:35 AM

## 2018-06-15 ENCOUNTER — Inpatient Hospital Stay (HOSPITAL_COMMUNITY): Admit: 2018-06-15 | Payer: Medicaid Other | Admitting: Obstetrics and Gynecology

## 2019-04-26 ENCOUNTER — Other Ambulatory Visit: Payer: Self-pay

## 2019-04-26 ENCOUNTER — Emergency Department (HOSPITAL_COMMUNITY)
Admission: EM | Admit: 2019-04-26 | Discharge: 2019-04-26 | Disposition: A | Discharge status: Home / Self Care | Payer: Self-pay | Attending: Emergency Medicine | Admitting: Emergency Medicine

## 2019-04-26 ENCOUNTER — Encounter (HOSPITAL_COMMUNITY): Payer: Self-pay | Admitting: *Deleted

## 2019-04-26 ENCOUNTER — Emergency Department (HOSPITAL_COMMUNITY): Payer: Self-pay

## 2019-04-26 DIAGNOSIS — M545 Low back pain, unspecified: Secondary | ICD-10-CM

## 2019-04-26 DIAGNOSIS — Z79899 Other long term (current) drug therapy: Secondary | ICD-10-CM | POA: Insufficient documentation

## 2019-04-26 HISTORY — DX: Disorder of kidney and ureter, unspecified: N28.9

## 2019-04-26 LAB — URINALYSIS, ROUTINE W REFLEX MICROSCOPIC
Bilirubin Urine: NEGATIVE
Glucose, UA: NEGATIVE mg/dL
Hgb urine dipstick: NEGATIVE
Ketones, ur: NEGATIVE mg/dL
Leukocytes,Ua: NEGATIVE
Nitrite: NEGATIVE
Protein, ur: NEGATIVE mg/dL
Specific Gravity, Urine: 1.031 — ABNORMAL HIGH (ref 1.005–1.030)
pH: 5 (ref 5.0–8.0)

## 2019-04-26 LAB — PREGNANCY, URINE: Preg Test, Ur: NEGATIVE

## 2019-04-26 MED ORDER — ACETAMINOPHEN 500 MG PO TABS
1000.0000 mg | ORAL_TABLET | Freq: Once | ORAL | Status: AC
  Administered 2019-04-26: 1000 mg via ORAL
  Filled 2019-04-26: qty 2

## 2019-04-26 MED ORDER — LIDOCAINE 5 % EX PTCH
1.0000 | MEDICATED_PATCH | CUTANEOUS | Status: DC
  Administered 2019-04-26: 1 via TRANSDERMAL
  Filled 2019-04-26: qty 1

## 2019-04-26 NOTE — Discharge Instructions (Addendum)
You you were seen in the emergency department for left low back pain.  I suspect your pain is either from a muscular overuse injury, spasm, strain.  Urinalysis did not have any signs of infection or blood.   X-rays obtained today are normal.   We usually treat this with a combination of anti-inflammatories, muscle relaxers, stretches, rest.  However given your current breast feeding we are limited in medicines we can use.    Start using the below regimen for the next 5-7 days [phone] mg of acetaminophen (Tylenol) every 6 hours for the next 3-5 days Do not use ibuprofen, or aspirin containing products during breastfeeding unless you decide to pump and dump.  We will avoid muscle relaxers today as well as these are present in breast mild.   Over-the-counter lidocaine-containing patches can be applied to the area of pain for 12 hours at a time Apply/rub diclofenac sodium gel on muscles until it feels dry every 6-8 hours, then can use heating pad for 20-30 mins to help absorb Rest for the next 48 hours to avoid further injury Transition back into daily activity slowly to avoid reinjury Start massage and stretches  Symptoms should improve over the next 48-72 hours and slowly resolve over a course of 7-10 days   Follow up with primary care doctor in 7-10 days if symptoms not improving. You can also follow up with ortho clinic or orthopedist doctor.    Return to the ER if your pain worsens, you develop abdominal pain, urinary symptoms, changes to your bowel movements, numbness in your groin, loss of bladder or bowel control, loss of sensation or heaviness or weakness to your extremities, fevers, burning with urination, blood in urine, flank pain

## 2019-04-26 NOTE — ED Triage Notes (Signed)
The pt is c/o lt flank pain for 3 days some urinary  Frequency  No blood in urine

## 2019-04-26 NOTE — ED Provider Notes (Signed)
[facility] Provider Note   CSN: [id number] Arrival date & time: 04/26/19  1509     History   Chief Complaint Chief Complaint  Patient presents with  . Flank Pain    HPI Carmen Wheeler is a 33 y.o. female presents today for evaluation of left low back pain that began 3 days ago.  Described as sharp.  It is constant but significantly worse when she does twisting and bending movements of her trunk and waist.  It is worse with palpation.  She has tried Tylenol without any relief.  She also has pain just at rest and sitting down.  Reports some radiating shooting pain to the front of her thigh intermittent.  Reports previous history of kidney stones but this feels different and lower.  She denies any fever, nausea, vomiting, abdominal pain, flank pain, dysuria, hematuria, urinary frequency or urgency.  No changes to bowel movements.  Denies any recent fall, trauma, exercise activities.  She had a cesarean section October 2019 and had an epidural.  She has been playing and carrying her almost 35-year-old child but denies any recent injury to her back.  No loss of sensation, weakness, saddle anesthesia, loss of bladder or bowel control.      HPI  Past Medical History:  Diagnosis Date  . Anemia   . Renal disorder     Patient Active Problem List   Diagnosis Date Noted  . Status post repeat low transverse cesarean section 06/08/2018    Past Surgical History:  Procedure Laterality Date  . CESAREAN SECTION    . CESAREAN SECTION N/A 06/08/2018   Procedure: REPEAT CESAREAN SECTION;  Surgeon: Osborn Cohooberts, Angela, MD;  [facility];  Service: Obstetrics;  Laterality: N/A;  RNFA Requested  Tracey  . WISDOM TOOTH EXTRACTION       OB History    Gravida  6   Para  3   Term  3   Preterm      AB  2   Living  3     SAB      TAB  2   Ectopic      Multiple      Live Births  3            Home Medications    Prior to  Admission medications   Medication Sig Start Date End Date Taking? Authorizing Provider  docusate sodium (COLACE) 100 MG capsule Take 1 capsule (100 mg total) by mouth 2 (two) times daily. 06/11/18 06/11/19  Kenney HousemanProthero, Nancy Jean, CNM  FEROSUL 325 (65 Fe) MG tablet Take 1 tablet (325 mg total) by mouth 2 (two) times daily with a meal. 06/11/18   Prothero, Henderson NewcomerNancy Jean, CNM  ibuprofen (ADVIL,MOTRIN) 600 MG tablet Take 1 tablet (600 mg total) by mouth every 6 (six) hours. 06/11/18   Prothero, Henderson NewcomerNancy Jean, CNM  oxyCODONE (OXY IR/ROXICODONE) 5 MG immediate release tablet Take 1 tablet (5 mg total) by mouth every 4 (four) hours as needed (pain scale 4-7). 06/11/18   Prothero, Henderson NewcomerNancy Jean, CNM  Prenatal Multivit-Min-Fe-FA (PRENATAL VITAMINS PO) Take 2 each by mouth daily.     [provider]    Family History No family history on file.  Social History Social History   Tobacco Use  . Smoking status: Never Smoker  . Smokeless tobacco: Never Used  Substance Use Topics  . Alcohol use: No  . Drug use: No     Allergies   Patient has  no known allergies.   Review of Systems Review of Systems  Musculoskeletal: Positive for back pain, gait problem and myalgias.  All other systems reviewed and are negative.    Physical Exam Updated Vital Signs BP 108/68   Pulse 73   Temp 98.9 F (37.2 C) (Oral)   Resp 18   Ht 4\' 11"  (1.499 m)   Wt 52.6 kg   SpO2 100%   BMI 23.43 kg/m   Physical Exam Vitals signs and nursing note reviewed.  Constitutional:      Appearance: She is well-developed.     Comments: Non toxic in NAD  HENT:     Head: Normocephalic and atraumatic.     Nose: Nose normal.  Eyes:     Conjunctiva/sclera: Conjunctivae normal.  Neck:     Musculoskeletal: Normal range of motion.  Cardiovascular:     Rate and Rhythm: Normal rate and regular rhythm.     Comments: 1+ DP and radial pulses bilaterally. Pulmonary:     Effort: Pulmonary effort is normal.     Breath  sounds: Normal breath sounds.  Abdominal:     General: Bowel sounds are normal.     Palpations: Abdomen is soft.     Tenderness: There is no abdominal tenderness.     Comments: No G/R/R. No suprapubic or CVA tenderness. Negative Murphy's and McBurney's. Active BS to lower quadrants.  No pulsatility.  Musculoskeletal: Normal range of motion.        General: Tenderness present.     Comments: T-spine: No midline or paraspinal muscle tenderness.  Overlying skin for thoracic back is normal. L-spine: No midline or right-sided paraspinal muscle tenderness.  There is mild low paraspinal muscle tenderness.  Tenderness to the upper buttock.  No focal tenderness to the SI joint, sciatic notch.  Negative SLR bilaterally.  Negative Faber. Pelvis: No focal bony tenderness to the bony prominences of the hips bilaterally.  Full range of motion, painless with the right hip.  Left hip deep flexion, IR, ER reproduce left low back pain.  No leg shortening or rotation.  No A/P/L instability.  Skin:    General: Skin is warm and dry.     Capillary Refill: Capillary refill takes less than 2 seconds.  Neurological:     Mental Status: She is alert.     Comments: Sensation and strength is intact in lower extremities.  Patient can SLR and hold for several seconds without any weakness but this causes pain in the left low back.  Patellar DTR symmetric bilaterally.  Psychiatric:        Behavior: Behavior normal.      ED Treatments / Results  Labs (all labs ordered are listed, but only abnormal results are displayed) Labs Reviewed  URINALYSIS, ROUTINE W REFLEX MICROSCOPIC - Abnormal; Notable for the following components:      Result Value   Specific Gravity, Urine 1.031 (*)    All other components within normal limits  PREGNANCY, URINE    EKG None  Radiology Dg Lumbar Spine Complete  Result Date: 04/26/2019 CLINICAL DATA:  Low back pain and left hip and leg pain. EXAM: LUMBAR SPINE - COMPLETE 4+ VIEW  COMPARISON:  None. FINDINGS: There is no evidence of lumbar spine fracture. Alignment is normal. Intervertebral disc spaces are maintained. IMPRESSION: Normal exam. Electronically Signed   By: Francene Boyers M.D.   On: 04/26/2019 16:56   Dg Hip Unilat W Or Wo Pelvis 2-3 Views Left  Result Date: 04/26/2019 CLINICAL DATA:  Left hip pain.  Low back pain. EXAM: DG HIP (WITH OR WITHOUT PELVIS) 2-3V LEFT COMPARISON:  None. FINDINGS: There is no evidence of hip fracture or dislocation. There is no evidence of arthropathy or other focal bone abnormality. IMPRESSION: Normal exam. Electronically Signed   By: Lorriane Shire M.D.   On: 04/26/2019 16:56    Procedures Procedures (including critical care time)  Medications Ordered in ED Medications  lidocaine (LIDODERM) 5 % 1 patch (1 patch Transdermal Patch Applied 04/26/19 1623)  acetaminophen (TYLENOL) tablet 1,000 mg (1,000 mg Oral Given 04/26/19 1623)     Initial Impression / Assessment and Plan / ED Course  I have reviewed the triage vital signs and the nursing notes.  Pertinent labs & imaging results that were available during my care of the patient were reviewed by me and considered in my medical decision making (see chart for details).  33 year old is here with left lumbar and buttock pain for 3 days.  This is reproducible on exam with palpation of musculature to this area.  Also worse with passive range of motion of the hip, weightbearing.  Highest on differential diagnosis is muscular strain versus spasm versus radicular symptoms.  There is no trauma.  X-rays obtained which were reviewed by me and radiologist.  No traumatic injuries to the lumbar or hip x-rays.  She had an epidural for a cesarean section almost a year ago and I doubt complications from this, such as a hematoma.  Her exam is otherwise benign.  Her strength and sensation is intact.  She has no signs of cauda equina.  She has no abdominal tenderness.  No urinary symptoms such as  hematuria, dysuria, urinary frequency to suggest pyelonephritis.  She has no CVA tenderness or suprapubic tenderness on exam either.  Her exam is much lower than the CVA and I doubt this etiology.  No overlying rash.  History and exam is not consistent with epidural abscess, dissection.  Will DC with conservative symptomatic management given that she is still breast-feeding.  Recommended Tylenol, Voltaren gel and lidocaine patches, massage and heat.  Recommended follow-up with orthopedist in 7 to 10 days for persistent symptoms.  Return precautions given.  She is aware if she is to develop hematuria, dysuria, urinary frequency or flank pain she is to return for reevaluation.  Patient is comfortable with this.  Final Clinical Impressions(s) / ED Diagnoses   Final diagnoses:  Acute left-sided low back pain without sciatica    ED Discharge Orders    None       Arlean Hopping 04/26/19 Mickle Mallory, MD 04/26/19 2040

## 2019-09-15 ENCOUNTER — Other Ambulatory Visit: Payer: Self-pay

## 2019-09-15 ENCOUNTER — Emergency Department (HOSPITAL_COMMUNITY)
Admission: EM | Admit: 2019-09-15 | Discharge: 2019-09-15 | Disposition: A | Discharge status: Home / Self Care | Payer: Medicaid Other | Attending: Emergency Medicine | Admitting: Emergency Medicine

## 2019-09-15 ENCOUNTER — Encounter (HOSPITAL_COMMUNITY): Payer: Self-pay

## 2019-09-15 DIAGNOSIS — G43909 Migraine, unspecified, not intractable, without status migrainosus: Secondary | ICD-10-CM | POA: Diagnosis not present

## 2019-09-15 DIAGNOSIS — R519 Headache, unspecified: Secondary | ICD-10-CM | POA: Diagnosis present

## 2019-09-15 LAB — PREGNANCY, URINE: Preg Test, Ur: NEGATIVE

## 2019-09-15 MED ORDER — PROCHLORPERAZINE EDISYLATE 10 MG/2ML IJ SOLN
10.0000 mg | Freq: Once | INTRAMUSCULAR | Status: AC
  Administered 2019-09-15: 23:00:00 10 mg via INTRAVENOUS
  Filled 2019-09-15: qty 2

## 2019-09-15 MED ORDER — KETOROLAC TROMETHAMINE 30 MG/ML IJ SOLN
30.0000 mg | Freq: Once | INTRAMUSCULAR | Status: AC
  Administered 2019-09-15: 30 mg via INTRAVENOUS

## 2019-09-15 MED ORDER — DIPHENHYDRAMINE HCL 50 MG/ML IJ SOLN
25.0000 mg | Freq: Once | INTRAMUSCULAR | Status: AC
  Administered 2019-09-15: 23:00:00 25 mg via INTRAVENOUS
  Filled 2019-09-15: qty 1

## 2019-09-15 MED ORDER — KETOROLAC TROMETHAMINE 30 MG/ML IJ SOLN
30.0000 mg | Freq: Once | INTRAMUSCULAR | Status: DC
  Filled 2019-09-15: qty 1

## 2019-09-15 MED ORDER — SODIUM CHLORIDE 0.9 % IV BOLUS
1000.0000 mL | Freq: Once | INTRAVENOUS | Status: AC
  Administered 2019-09-15: 1000 mL via INTRAVENOUS

## 2019-09-15 NOTE — Discharge Instructions (Addendum)
Return to the ED for worsening symptoms, numbness in arms or legs, chest pain, shortness of breath, neck pain or stiffness.

## 2019-09-15 NOTE — ED Provider Notes (Signed)
[facility]-EMERGENCY DEPT Provider Note   CSN: [id number] Arrival date & time: 09/15/19  1652     History Chief Complaint  Patient presents with  . Migraine    Carmen Wheeler is a 34 y.o. female with a past medical history of migraines presenting to the ED with a chief complaint of migraine.  Reports generalized headache with associated photophobia, nausea, phonophobia.  Symptoms began at approximately 1 AM this morning.  She has a history of migraines once or twice a week which usually resolve with over-the-counter pain medications.  She has tried 1 dose of Tylenol without significant improvement in her headache.  She has had a few episodes of blurry vision which resolve after rubbing her eyes. She has never seen a neurologist or been on any preventative migraine medications.  Denies any vomiting, head injuries or falls, numbness in arms or legs, diplopia, family or personal history of aneurysms, neck pain or stiffness, fever, sick contacts with similar symptoms.  She is currently breast-feeding her 24mo child.  HPI     Past Medical History:  Diagnosis Date  . Anemia   . Renal disorder     Patient Active Problem List   Diagnosis Date Noted  . Status post repeat low transverse cesarean section 06/08/2018    Past Surgical History:  Procedure Laterality Date  . CESAREAN SECTION    . CESAREAN SECTION N/A 06/08/2018   Procedure: REPEAT CESAREAN SECTION;  Surgeon: Osborn Coho, MD;  Location: [facility] BIRTHING SUIT[facility];  Service: Obstetrics;  Laterality: N/A;  RNFA Requested  Tracey  . WISDOM TOOTH EXTRACTION       OB History    Gravida  6   Para  3   Term  3   Preterm      AB  2   Living  3     SAB      TAB  2   Ectopic      Multiple      Live Births  3           Family History  Problem Relation Age of Onset  . Healthy Mother   . Healthy Father     Social History   Tobacco Use  . Smoking status: Never Smoker  . Smokeless  tobacco: Never Used  Substance Use Topics  . Alcohol use: No  . Drug use: No    Home Medications Prior to Admission medications   Medication Sig Start Date End Date Taking? Authorizing Provider  acetaminophen (TYLENOL) 325 MG tablet Take 650 mg by mouth every 6 (six) hours as needed for headache.   Yes [provider]  ibuprofen (ADVIL,MOTRIN) 600 MG tablet Take 1 tablet (600 mg total) by mouth every 6 (six) hours. Patient taking differently: Take 600 mg by mouth every 6 (six) hours as needed for headache.  06/11/18  Yes Prothero, Henderson Newcomer, CNM  FEROSUL 325 (65 Fe) MG tablet Take 1 tablet (325 mg total) by mouth 2 (two) times daily with a meal. Patient not taking: Reported on 09/15/2019 06/11/18   Prothero, Henderson Newcomer, CNM  oxyCODONE (OXY IR/ROXICODONE) 5 MG immediate release tablet Take 1 tablet (5 mg total) by mouth every 4 (four) hours as needed (pain scale 4-7). Patient not taking: Reported on 09/15/2019 06/11/18   Prothero, Henderson Newcomer, CNM    Allergies    Patient has no known allergies.  Review of Systems   Review of Systems  Constitutional: Negative for appetite change, chills and fever.  HENT: Negative for ear pain, rhinorrhea, sneezing and sore throat.   Eyes: Positive for photophobia. Negative for visual disturbance.  Respiratory: Negative for cough, chest tightness, shortness of breath and wheezing.   Cardiovascular: Negative for chest pain and palpitations.  Gastrointestinal: Positive for nausea. Negative for abdominal pain, blood in stool, constipation, diarrhea and vomiting.  Genitourinary: Negative for dysuria, hematuria and urgency.  Musculoskeletal: Negative for myalgias, neck pain and neck stiffness.  Skin: Negative for rash.  Neurological: Positive for headaches. Negative for dizziness, weakness and light-headedness.    Physical Exam Updated Vital Signs BP (!) 99/59 (BP Location: Left Arm)   Pulse 93   Temp 98.5 F (36.9 C) (Oral)   Resp 14   Ht 4'  11" (1.499 m)   Wt 52.2 kg   SpO2 100%   BMI 23.23 kg/m   Physical Exam Vitals and nursing note reviewed.  Constitutional:      General: She is not in acute distress.    Appearance: She is well-developed.  HENT:     Head: Normocephalic and atraumatic.     Nose: Nose normal.  Eyes:     General: No scleral icterus.       Right eye: No discharge.        Left eye: No discharge.     Conjunctiva/sclera: Conjunctivae normal.     Pupils: Pupils are equal, round, and reactive to light.  Neck:     Comments: No meningismus. Cardiovascular:     Rate and Rhythm: Normal rate and regular rhythm.     Heart sounds: Normal heart sounds. No murmur. No friction rub. No gallop.   Pulmonary:     Effort: Pulmonary effort is normal. No respiratory distress.     Breath sounds: Normal breath sounds.  Abdominal:     General: Bowel sounds are normal. There is no distension.     Palpations: Abdomen is soft.     Tenderness: There is no abdominal tenderness. There is no guarding.  Musculoskeletal:        General: Normal range of motion.     Cervical back: Normal range of motion and neck supple.  Skin:    General: Skin is warm and dry.     Findings: No rash.  Neurological:     General: No focal deficit present.     Mental Status: She is alert and oriented to person, place, and time.     Cranial Nerves: No cranial nerve deficit.     Sensory: No sensory deficit.     Motor: No weakness or abnormal muscle tone.     Coordination: Coordination normal.     Comments: Pupils reactive. No facial asymmetry noted. Cranial nerves appear grossly intact. Sensation intact to light touch on face, BUE and BLE. Strength 5/5 in BUE and BLE. Normal patellar reflexes bilaterally.     ED Results / Procedures / Treatments   Labs (all labs ordered are listed, but only abnormal results are displayed) Labs Reviewed  PREGNANCY, URINE  POC URINE PREG, ED    EKG None  Radiology No results  found.  Procedures Procedures (including critical care time)  Medications Ordered in ED Medications  sodium chloride 0.9 % bolus 1,000 mL (0 mLs Intravenous Stopped 09/15/19 2255)  prochlorperazine (COMPAZINE) injection 10 mg (10 mg Intravenous Given 09/15/19 2233)  diphenhydrAMINE (BENADRYL) injection 25 mg (25 mg Intravenous Given 09/15/19 2234)  ketorolac (TORADOL) 30 MG/ML injection 30 mg (30 mg Intravenous Given 09/15/19 2254)    ED Course  I have reviewed the triage vital signs and the nursing notes.  Pertinent labs & imaging results that were available during my care of the patient were reviewed by me and considered in my medical decision making (see chart for details).    MDM Rules/Calculators/A&P                      34 year old female with a past medical history of migraines presents to the ED with a migraine that began about 18 to 20 hours ago.  Usually her headaches improved with over-the-counter medications.  She took Tylenol earlier today without significant improvement in her symptoms.  Reports associated photophobia, phonophobia and nausea.  On exam patient has no neurological deficits.  She has no neck stiffness or meningeal signs.  She is afebrile with no recent use of antipyretics.  Denies personal or family history of aneurysms.  Pregnancy test is negative.  Will give migraine cocktail and reassess.  11:02 PM Patient with resolution of her migraine.  She is resting comfortably.  She is comfortable with discharge home. There are no headache characteristics that are lateralizing or concerning for increased ICP, infectious or vascular cause of her symptoms.  Told her that pharmacist recommends her "pumping and dumping" her breast milk before feeding her child due to medications that we gave her today.  Patient is agreeable to the plan.  Given strict return precautions.  Patient is hemodynamically stable, in NAD, and able to ambulate in the ED. Evaluation does not show pathology  that would require ongoing emergent intervention or inpatient treatment. I explained the diagnosis to the patient. Pain has been managed and has no complaints prior to discharge. Patient is comfortable with above plan and is stable for discharge at this time. All questions were answered prior to disposition. Strict return precautions for returning to the ED were discussed. Encouraged follow up with PCP.   An After Visit Summary was printed and given to the patient.   Portions of this note were generated with Scientist, clinical (histocompatibility and immunogenetics). Dictation errors may occur despite best attempts at proofreading.  Final Clinical Impression(s) / ED Diagnoses Final diagnoses:  Migraine without status migrainosus, not intractable, unspecified migraine type    Rx / DC Orders ED Discharge Orders    None       Dietrich Pates, PA-C 09/15/19 2304    Jacalyn Lefevre, MD 09/15/19 2306

## 2019-09-15 NOTE — ED Triage Notes (Signed)
Per EMS- Patient reports that she has had headaches over a year and in the past 2 months they have been weekly.  Patient c/o headache that began at 0300 today. Patient also c/o sensitivity to light and sound. Patient also c/o intermittent nausea.

## 2020-03-21 ENCOUNTER — Other Ambulatory Visit: Payer: Self-pay

## 2020-03-21 ENCOUNTER — Emergency Department (HOSPITAL_COMMUNITY)
Admission: EM | Admit: 2020-03-21 | Discharge: 2020-03-21 | Disposition: A | Discharge status: Home / Self Care | Payer: Medicaid Other | Attending: Emergency Medicine | Admitting: Emergency Medicine

## 2020-03-21 ENCOUNTER — Encounter (HOSPITAL_COMMUNITY): Payer: Self-pay

## 2020-03-21 DIAGNOSIS — R519 Headache, unspecified: Secondary | ICD-10-CM

## 2020-03-21 DIAGNOSIS — R110 Nausea: Secondary | ICD-10-CM | POA: Insufficient documentation

## 2020-03-21 DIAGNOSIS — H93233 Hyperacusis, bilateral: Secondary | ICD-10-CM | POA: Insufficient documentation

## 2020-03-21 DIAGNOSIS — H53149 Visual discomfort, unspecified: Secondary | ICD-10-CM | POA: Insufficient documentation

## 2020-03-21 LAB — PREGNANCY, URINE: Preg Test, Ur: NEGATIVE

## 2020-03-21 MED ORDER — DIPHENHYDRAMINE HCL 50 MG/ML IJ SOLN
25.0000 mg | Freq: Once | INTRAMUSCULAR | Status: AC
  Administered 2020-03-21: 25 mg via INTRAVENOUS
  Filled 2020-03-21: qty 1

## 2020-03-21 MED ORDER — METOCLOPRAMIDE HCL 5 MG/ML IJ SOLN
10.0000 mg | Freq: Once | INTRAMUSCULAR | Status: AC
  Administered 2020-03-21: 10 mg via INTRAVENOUS
  Filled 2020-03-21: qty 2

## 2020-03-21 MED ORDER — SODIUM CHLORIDE 0.9 % IV BOLUS
1000.0000 mL | Freq: Once | INTRAVENOUS | Status: AC
  Administered 2020-03-21: 1000 mL via INTRAVENOUS

## 2020-03-21 MED ORDER — DEXAMETHASONE SODIUM PHOSPHATE 4 MG/ML IJ SOLN
4.0000 mg | Freq: Once | INTRAMUSCULAR | Status: AC
  Administered 2020-03-21: 4 mg via INTRAVENOUS
  Filled 2020-03-21: qty 1

## 2020-03-21 NOTE — ED Provider Notes (Signed)
[facility]-EMERGENCY DEPT Provider Note   CSN: [id number] Arrival date & time: 03/21/20  1114     History Chief Complaint  Patient presents with  . Migraine    Carmen Wheeler is a 34 y.o. female with PMHx migraines who presents to the ED today with complaint of gradual onset, constant, throbbing, headache x 2 weeks. Pt also complains of photophobia, phonophobia, and nausea. She has been taking Tylenol at home without relief. Pt states this feels similar to previous headaches. Pt denies any recent sick contacts. She denies risk of pregnancy as she receives depo injections. Denies fevers, chills, vision changes, neck stiffness, rash, confusion, unilateral weakness or numbness, or any other associated symptoms.   The history is provided by the patient and medical records.       Past Medical History:  Diagnosis Date  . Anemia   . Renal disorder     Patient Active Problem List   Diagnosis Date Noted  . Status post repeat low transverse cesarean section 06/08/2018    Past Surgical History:  Procedure Laterality Date  . CESAREAN SECTION    . CESAREAN SECTION N/A 06/08/2018   Procedure: REPEAT CESAREAN SECTION;  Surgeon: Osborn Coho, MD;  Location: [facility] BIRTHING SUITES;  Service: Obstetrics;  Laterality: N/A;  RNFA Requested  Tracey  . WISDOM TOOTH EXTRACTION       OB History    Gravida  6   Para  3   Term  3   Preterm      AB  2   Living  3     SAB      TAB  2   Ectopic      Multiple      Live Births  3           Family History  Problem Relation Age of Onset  . Healthy Mother   . Healthy Father     Social History   Tobacco Use  . Smoking status: Never Smoker  . Smokeless tobacco: Never Used  Vaping Use  . Vaping Use: Never used  Substance Use Topics  . Alcohol use: No  . Drug use: No    Home Medications Prior to Admission medications   Medication Sig Start Date End Date Taking? Authorizing Provider    acetaminophen (TYLENOL) 325 MG tablet Take 650 mg by mouth every 6 (six) hours as needed for headache.   Yes [provider]  FEROSUL 325 (65 Fe) MG tablet Take 1 tablet (325 mg total) by mouth 2 (two) times daily with a meal. Patient not taking: Reported on 09/15/2019 06/11/18   Prothero, Henderson Newcomer, CNM  ibuprofen (ADVIL,MOTRIN) 600 MG tablet Take 1 tablet (600 mg total) by mouth every 6 (six) hours. Patient not taking: Reported on 03/21/2020 06/11/18   Prothero, Henderson Newcomer, CNM  oxyCODONE (OXY IR/ROXICODONE) 5 MG immediate release tablet Take 1 tablet (5 mg total) by mouth every 4 (four) hours as needed (pain scale 4-7). Patient not taking: Reported on 09/15/2019 06/11/18   Prothero, Henderson Newcomer, CNM    Allergies    Patient has no known allergies.  Review of Systems   Review of Systems  Constitutional: Negative for chills and fever.  Eyes: Positive for photophobia. Negative for visual disturbance.  Respiratory: Negative for shortness of breath.   Cardiovascular: Negative for chest pain.  Gastrointestinal: Positive for nausea. Negative for constipation, diarrhea and vomiting.  Neurological: Positive for headaches. Negative for dizziness, speech difficulty, weakness, light-headedness and  numbness.  All other systems reviewed and are negative.   Physical Exam Updated Vital Signs BP (!) 101/58 (BP Location: Right Arm)   Pulse 75   Temp 98 F (36.7 C) (Oral)   Resp 16   Ht 4\' 11"  (1.499 m)   Wt 59 kg   SpO2 100%   BMI 26.26 kg/m   Physical Exam Vitals and nursing note reviewed.  Constitutional:      Appearance: She is not ill-appearing or diaphoretic.  HENT:     Head: Normocephalic and atraumatic.  Eyes:     Extraocular Movements: Extraocular movements intact.     Conjunctiva/sclera: Conjunctivae normal.     Pupils: Pupils are equal, round, and reactive to light.  Neck:     Meningeal: Brudzinski's sign and Kernig's sign absent.  Cardiovascular:     Rate and Rhythm:  Normal rate and regular rhythm.     Pulses: Normal pulses.  Pulmonary:     Effort: Pulmonary effort is normal.     Breath sounds: Normal breath sounds. No wheezing, rhonchi or rales.  Abdominal:     Palpations: Abdomen is soft.     Tenderness: There is no abdominal tenderness. There is no guarding or rebound.  Musculoskeletal:     Cervical back: Neck supple.  Skin:    General: Skin is warm and dry.  Neurological:     Mental Status: She is alert.     Comments: CN 3-12 grossly intact A&O x4 GCS 15 Sensation and strength intact Gait nonataxic including with tandem walking Coordination with finger-to-nose WNL Neg romberg, neg pronator drift      ED Results / Procedures / Treatments   Labs (all labs ordered are listed, but only abnormal results are displayed) Labs Reviewed  PREGNANCY, URINE    EKG None  Radiology No results found.  Procedures Procedures (including critical care time)  Medications Ordered in ED Medications  sodium chloride 0.9 % bolus 1,000 mL (1,000 mLs Intravenous Bolus from Bag 03/21/20 1447)  metoCLOPramide (REGLAN) injection 10 mg (10 mg Intravenous Given 03/21/20 1440)  dexamethasone (DECADRON) injection 4 mg (4 mg Intravenous Given 03/21/20 1441)  diphenhydrAMINE (BENADRYL) injection 25 mg (25 mg Intravenous Given 03/21/20 1439)    ED Course  I have reviewed the triage vital signs and the nursing notes.  Pertinent labs & imaging results that were available during my care of the patient were reviewed by me and considered in my medical decision making (see chart for details).    MDM Rules/Calculators/A&P                          34 year old female who presents to the ED today with complaint of headache for the past 2 weeks.  History of migraine headaches, states this feels similar.  She has associated photophobia, phonophobia, nausea.  On arrival to the ED patient is afebrile nontachycardic nontachypneic.  She appears to be in no acute distress.  She  is sitting in a darkened room.  She has no focal neuro deficits exam.  No meningeal signs.  Will treat with headache cocktail at this time as well as fluids and reevaluate.   On reevaluation pt resting comfortably and reports headache is dissipating however still mildly present; fluids still running. Will reevaluate after fluids are done. Plan to discharge home.   At shift change case signed out to [facility], PA-C, who will dispo patient after fluids/reassessment are done. Pt to follow up with PCP.  This note was prepared using Dragon voice recognition software and may include unintentional dictation errors due to the inherent limitations of voice recognition software.  Final Clinical Impression(s) / ED Diagnoses Final diagnoses:  Bad headache    Rx / DC Orders ED Discharge Orders    None       Tanda Rockers, PA-C 03/21/20 1511    Linwood Dibbles, MD 03/21/20 1555

## 2020-03-21 NOTE — Discharge Instructions (Addendum)
Please follow up with your PCP. If you do not have one you can follow up with [facility] for primary care needs.  Drink plenty of fluids and get rest.  Return to the ED IMMEDIATELY for any worsening symptoms including worsening headache, blurry vision, double vision, dizziness, lightheadedness, fevers > 100.4, confusion, weakness or numbness on one side of your body, neck stiffness, rash, or any other new/concerning symptoms.

## 2020-03-21 NOTE — ED Triage Notes (Signed)
Pt arrives POV from home with complaints of a Migraine for 2 weeks. Pt reports she has a hx of migraines. Pt reports this migraine is similar to her other headaches. Pt denies trauma. Pt reports nausea, denies vomiting. Pt reports taking tylenol.

## 2020-03-21 NOTE — ED Provider Notes (Signed)
Received patient at signout from [facility].  Refer to provider note for full history and physical examination.  Briefly patient is a 34 year old female with history of migraine headaches presenting for evaluation of migraine.  Physical examination reassuring.  Pending migraine cocktail and reassessment.  Plan for likely discharge.  Physical Exam  BP 117/82   Pulse 81   Temp 98 F (36.7 C) (Oral)   Resp 16   Ht 4\' 11"  (1.499 m)   Wt 59 kg   SpO2 100%   BMI 26.26 kg/m   Physical Exam Vitals and nursing note reviewed.  Constitutional:      General: She is not in acute distress.    Appearance: She is well-developed.     Comments: Resting comfortably in bed  HENT:     Head: Normocephalic and atraumatic.  Eyes:     General:        Right eye: No discharge.        Left eye: No discharge.     Conjunctiva/sclera: Conjunctivae normal.  Neck:     Vascular: No JVD.     Trachea: No tracheal deviation.  Cardiovascular:     Rate and Rhythm: Normal rate.  Pulmonary:     Effort: Pulmonary effort is normal.  Abdominal:     General: There is no distension.  Skin:    General: Skin is warm and dry.     Findings: No erythema.  Neurological:     Mental Status: She is alert.     Comments: Speech fluent and goal oriented.  Moves all extremities spontaneously without difficulty.  No facial droop noted.  Psychiatric:        Behavior: Behavior normal.     ED Course/Procedures     Procedures  MDM  Patient reports headache down to 3/10 in severity and that she is feeling much better.  She would like to go home.  Ambulatory to the bathroom independently without difficulty.  Recommend close follow-up with PCP for reevaluation of symptoms.  Discussed strict ED return precautions.  Patient verbalized understanding of and agreement with plan and patient is stable for discharge at this time.      , PA-C 03/21/20 1600    05/21/20, MD 03/22/20 1020

## 2021-03-05 IMAGING — DX DG HIP (WITH OR WITHOUT PELVIS) 2-3V*L*
3 series · 3 of 3 positions shown · non-contrast
Comparison: None.

CLINICAL DATA: Left hip pain.  Low back pain.

EXAM:
DG HIP (WITH OR WITHOUT PELVIS) 2-3V LEFT

[pelvis ap]
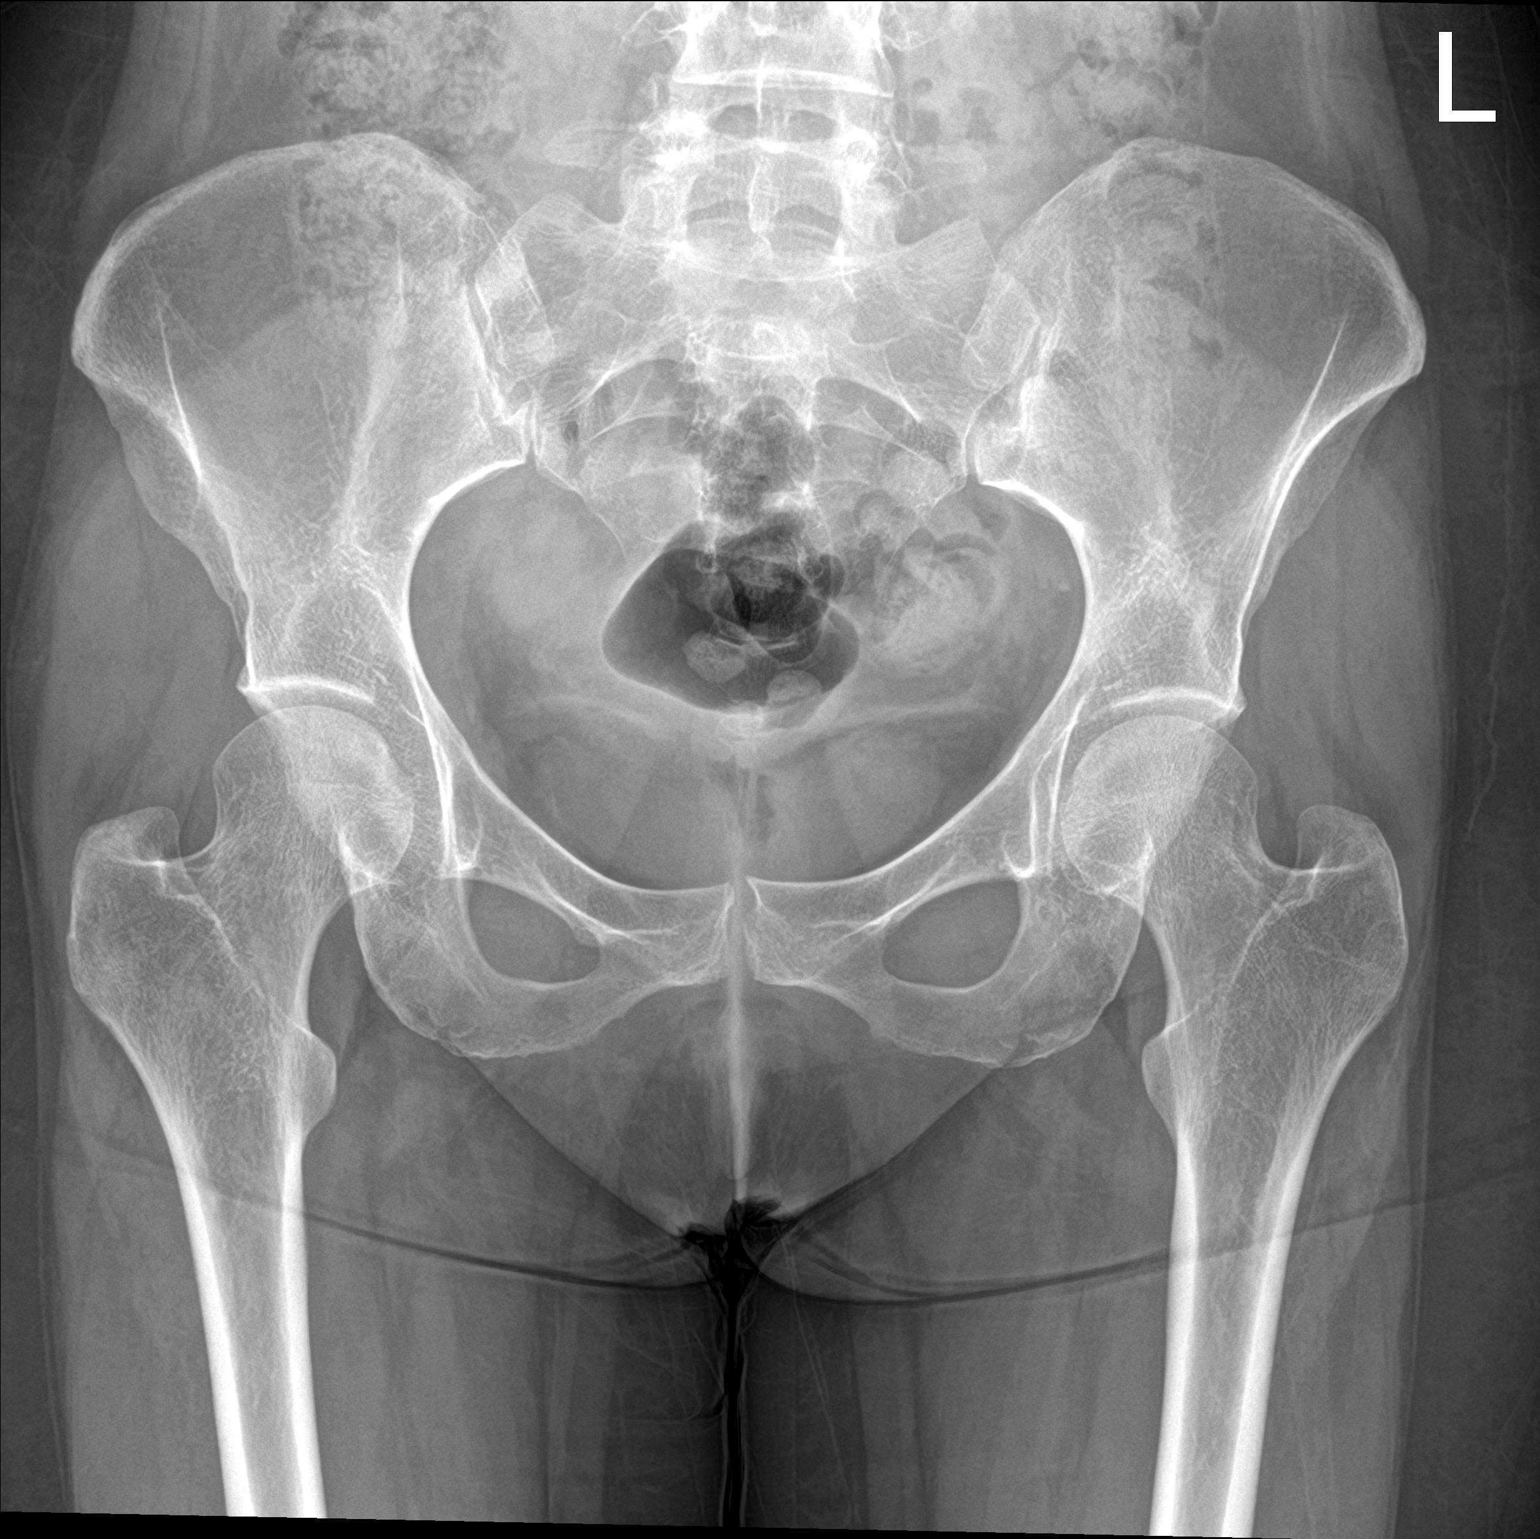

[hip ap]
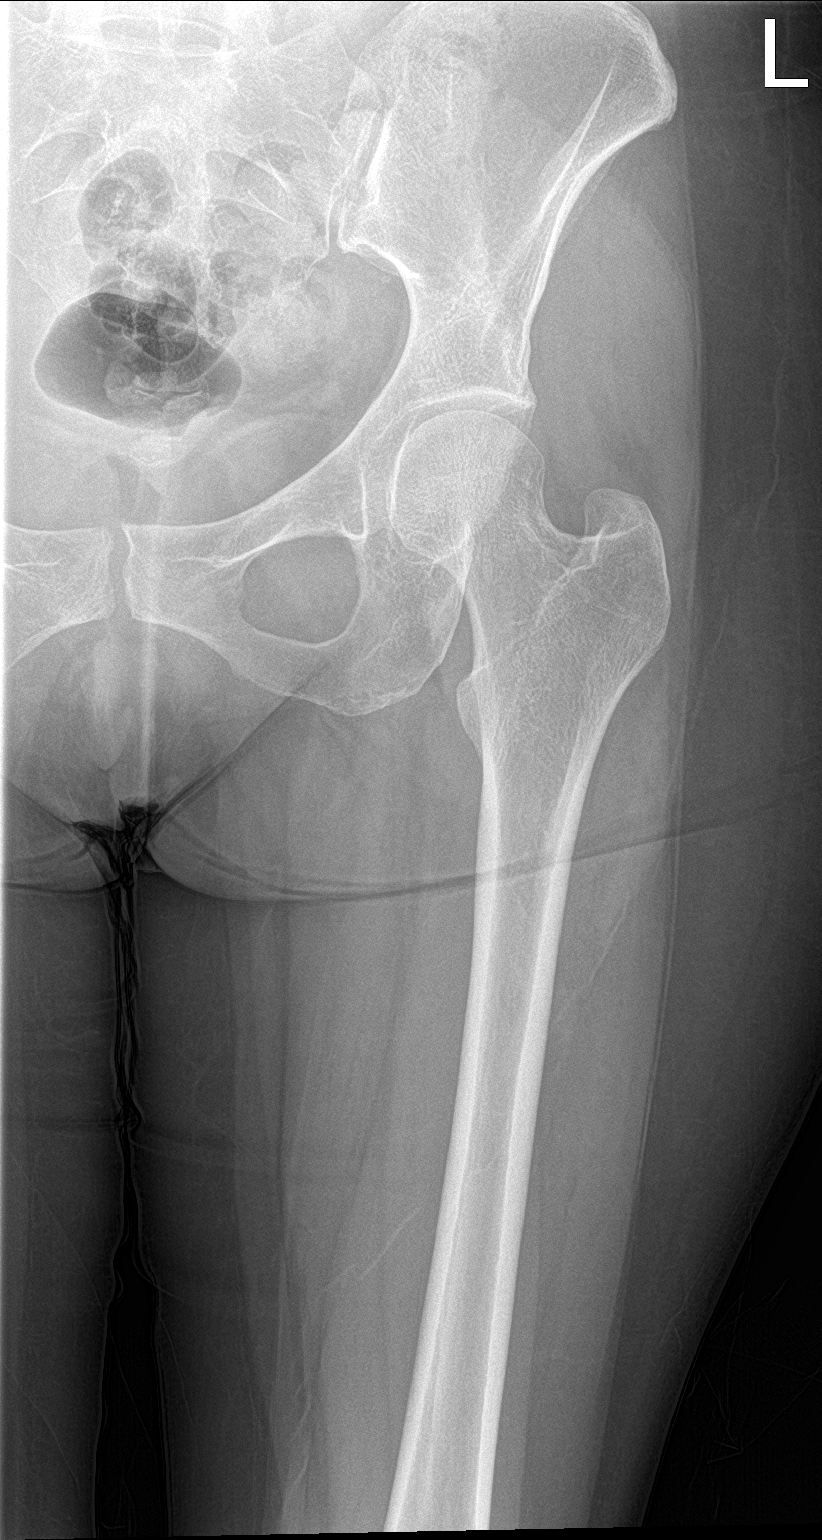

[hip lat]
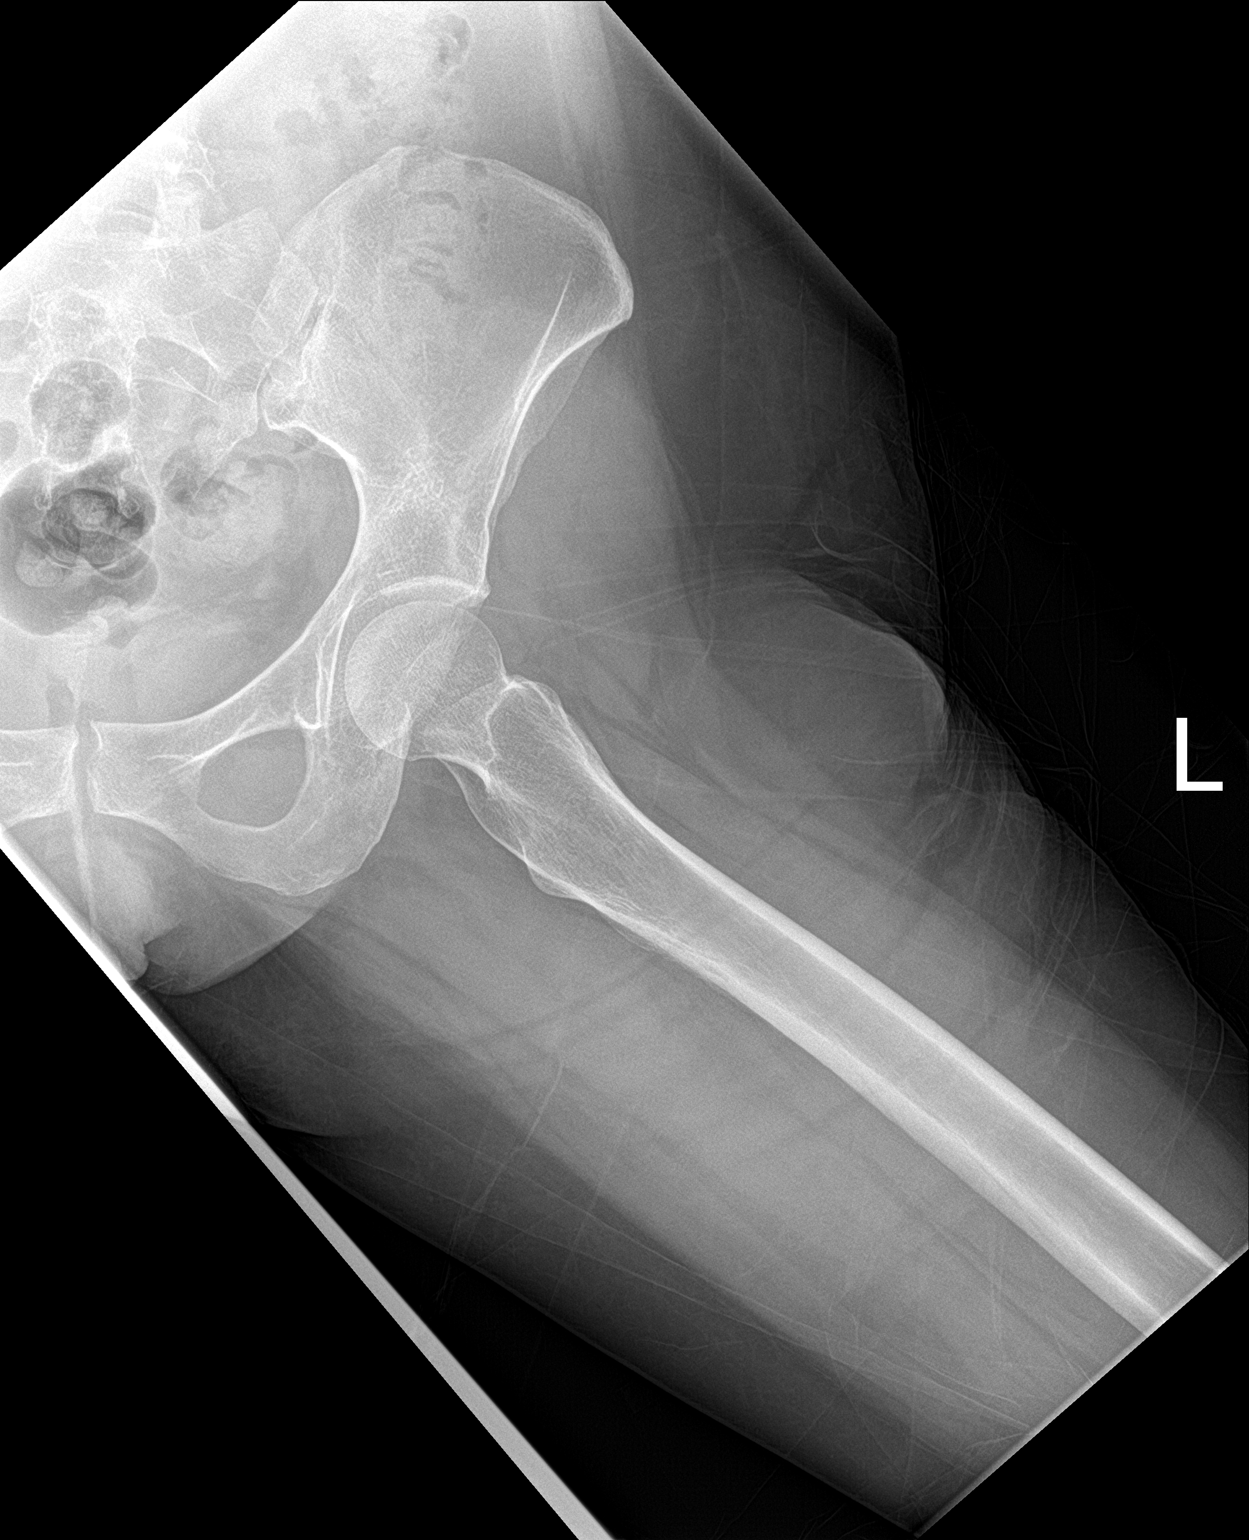

[3 of 3 positions shown; findings below may reference images not displayed]

FINDINGS: There is no evidence of hip fracture or dislocation. There is no
evidence of arthropathy or other focal bone abnormality.
IMPRESSION: Normal exam.

## 2022-01-12 ENCOUNTER — Emergency Department (HOSPITAL_COMMUNITY)
Admission: EM | Admit: 2022-01-12 | Discharge: 2022-01-12 | Disposition: A | Discharge status: Home / Self Care | Payer: Self-pay | Attending: Emergency Medicine | Admitting: Emergency Medicine

## 2022-01-12 ENCOUNTER — Encounter (HOSPITAL_COMMUNITY): Payer: Self-pay | Admitting: Emergency Medicine

## 2022-01-12 DIAGNOSIS — M5442 Lumbago with sciatica, left side: Secondary | ICD-10-CM | POA: Insufficient documentation

## 2022-01-12 MED ORDER — METHOCARBAMOL 500 MG PO TABS: 500.0000 mg | ORAL_TABLET | Freq: Two times a day (BID) | ORAL | 0 refills | Status: DC

## 2022-01-12 NOTE — Discharge Instructions (Signed)
Please use Tylenol or ibuprofen for pain.  You may use 600-800 mg ibuprofen every 6 hours or 1000 mg of Tylenol every 6 hours.  You may choose to alternate between the 2.  This would be most effective.  Not to exceed 4 g of Tylenol within 24 hours.  Not to exceed 3200 mg ibuprofen 24 hours.  You can use the Robaxin I am prescribing up to twice daily.  Be cautious as this medication can make you drowsy.  It is safe to take at night but you may want to exercise caution before piloting a motor vehicle or operating any heavy machinery.  You can apply some ice or heat directly to the area where it hurts.  You may benefit from chiropractic adjustment if you would like to try something else.  I recommend that you do some rehab exercises as discussed in the attachments I provided.  If you have worsening pain, new numbness, inability to urinate or defecate please return to the emergency department.  If your pain persists despite treatment I recommend that you follow-up with orthopedics for further evaluation.

## 2022-01-12 NOTE — ED Provider Notes (Signed)
[facility]-EMERGENCY DEPT Provider Note   CSN: [id number] Arrival date & time: 01/12/22  1732     History  Chief Complaint  Patient presents with   Back Pain    Carmen Wheeler is a 36 y.o. female with no signet past medical history who presents with concern for left-sided back pain since Tuesday.  Patient reports she was working on [employer], doing some yoga and other exercises but does not remember a specific instance when she heard something pop or tear.  Patient reports that she began having left-sided back pain without numbness, tingling, weakness but it has not responded to ibuprofen, Tylenol, or heating pad.  Patient denies history of IV drug use, cancer, groin numbness, tingling, difficulty with urination, defecation, chronic corticosteroid use.   Back Pain     Home Medications Prior to Admission medications   Medication Sig Start Date End Date Taking? Authorizing Provider  methocarbamol (ROBAXIN) 500 MG tablet Take 1 tablet (500 mg total) by mouth 2 (two) times daily. 01/12/22  Yes Aleksander Edmiston H, PA-C  acetaminophen (TYLENOL) 325 MG tablet Take 650 mg by mouth every 6 (six) hours as needed for headache.    [provider]  FEROSUL 325 (65 Fe) MG tablet Take 1 tablet (325 mg total) by mouth 2 (two) times daily with a meal. Patient not taking: Reported on 09/15/2019 06/11/18   Prothero, Henderson Newcomer, CNM  ibuprofen (ADVIL,MOTRIN) 600 MG tablet Take 1 tablet (600 mg total) by mouth every 6 (six) hours. Patient not taking: Reported on 03/21/2020 06/11/18   Prothero, Henderson Newcomer, CNM  oxyCODONE (OXY IR/ROXICODONE) 5 MG immediate release tablet Take 1 tablet (5 mg total) by mouth every 4 (four) hours as needed (pain scale 4-7). Patient not taking: Reported on 09/15/2019 06/11/18   Prothero, Henderson Newcomer, CNM      Allergies    Patient has no known allergies.    Review of Systems   Review of Systems  Musculoskeletal:  Positive for back pain.  All  other systems reviewed and are negative.  Physical Exam Updated Vital Signs BP 130/77   Pulse 88   Temp 98.9 F (37.2 C)   Resp 18   SpO2 96%  Physical Exam Vitals and nursing note reviewed.  Constitutional:      General: She is not in acute distress.    Appearance: Normal appearance.  HENT:     Head: Normocephalic and atraumatic.  Eyes:     General:        Right eye: No discharge.        Left eye: No discharge.  Cardiovascular:     Rate and Rhythm: Normal rate and regular rhythm.  Pulmonary:     Effort: Pulmonary effort is normal. No respiratory distress.  Musculoskeletal:        General: No deformity.     Comments: Tenderness to palpation of the lumbar paraspinous muscles on the left.  Intact strength 5 out of 5 bilateral upper and lower extremities.  No midline spinal tenderness throughout.  Skin:    General: Skin is warm and dry.  Neurological:     Mental Status: She is alert and oriented to person, place, and time.  Psychiatric:        Mood and Affect: Mood normal.        Behavior: Behavior normal.    ED Results / Procedures / Treatments   Labs (all labs ordered are listed, but only abnormal results are displayed) Labs Reviewed -  No data to display  EKG None  Radiology No results found.  Procedures Procedures    Medications Ordered in ED Medications - No data to display  ED Course/ Medical Decision Making/ A&P                           Medical Decision Making Risk Prescription drug management.   Patient with back pain.  My emergent differential diagnosis includes slipped disc, compression fracture, spondylolisthesis, less clinical concern for epidural abscess or osteomyelitis based on patient history.  No neurological deficits. Patient is ambulatory. No warning symptoms of back pain including: fecal incontinence, urinary retention or overflow incontinence, night sweats, waking from sleep with back pain, unexplained fevers or weight loss, h/o cancer,  IVDU, recent trauma. No concern for cauda equina, epidural abscess, or other serious cause of back pain.  Given this work-up, evaluation, physical exam I do not believe that radiographic imaging is indicated at this time.  Conservative measures such as rest, ice/heat, ibuprofen, Tylenol, and  prescription for Robaxin indicated with orthopedic follow-up if no improvement with conservative management.  Extensive return precautions given, patient discharged in stable condition at this time.  Final Clinical Impression(s) / ED Diagnoses Final diagnoses:  Acute left-sided low back pain with left-sided sciatica    Rx / DC Orders ED Discharge Orders          Ordered    methocarbamol (ROBAXIN) 500 MG tablet  2 times daily        01/12/22 1837              Shakala Marlatt, Edyth Gunnels 01/12/22 1842    Lorre Nick, MD 01/15/22 1504

## 2022-01-12 NOTE — ED Triage Notes (Signed)
Patient c/o lower back pain R>L x3 days. Denies injury and urinary symptoms. Reports pain worsens with movement.

## 2022-08-09 ENCOUNTER — Emergency Department (HOSPITAL_COMMUNITY)
Admission: EM | Admit: 2022-08-09 | Discharge: 2022-08-09 | Disposition: A | Discharge status: Home / Self Care | Payer: Self-pay | Attending: Emergency Medicine | Admitting: Emergency Medicine

## 2022-08-09 ENCOUNTER — Other Ambulatory Visit: Payer: Self-pay

## 2022-08-09 ENCOUNTER — Encounter (HOSPITAL_COMMUNITY): Payer: Self-pay

## 2022-08-09 DIAGNOSIS — R519 Headache, unspecified: Secondary | ICD-10-CM | POA: Insufficient documentation

## 2022-08-09 HISTORY — DX: Migraine, unspecified, not intractable, without status migrainosus: G43.909

## 2022-08-09 LAB — PREGNANCY, URINE: Preg Test, Ur: NEGATIVE

## 2022-08-09 MED ORDER — KETOROLAC TROMETHAMINE 15 MG/ML IJ SOLN
15.0000 mg | Freq: Once | INTRAMUSCULAR | Status: AC
  Administered 2022-08-09: 15 mg via INTRAVENOUS
  Filled 2022-08-09: qty 1

## 2022-08-09 MED ORDER — DIPHENHYDRAMINE HCL 25 MG PO CAPS
25.0000 mg | ORAL_CAPSULE | Freq: Once | ORAL | Status: AC
  Administered 2022-08-09: 25 mg via ORAL
  Filled 2022-08-09: qty 1

## 2022-08-09 MED ORDER — PROCHLORPERAZINE EDISYLATE 10 MG/2ML IJ SOLN
10.0000 mg | Freq: Once | INTRAMUSCULAR | Status: AC
  Administered 2022-08-09: 10 mg via INTRAVENOUS
  Filled 2022-08-09: qty 2

## 2022-08-09 MED ORDER — KETOROLAC TROMETHAMINE 15 MG/ML IJ SOLN: 15.0000 mg | Freq: Once | INTRAMUSCULAR | Status: DC

## 2022-08-09 MED ORDER — SODIUM CHLORIDE 0.9 % IV BOLUS
1000.0000 mL | Freq: Once | INTRAVENOUS | Status: AC
  Administered 2022-08-09: 1000 mL via INTRAVENOUS

## 2022-08-09 NOTE — ED Provider Triage Note (Signed)
Emergency Medicine Provider Triage Evaluation Note  Carmen Wheeler , a 36 y.o. female  was evaluated in triage.  Pt complains of headache. States that same has been ongoing for the past 2 days.  She states that this headache is exactly the same as headache she has had previously.  She is unable to resolve her symptoms with home meds.  Endorses associated photophobia and nausea.  Denies any vision changes or weakness.  Denies fevers, chills.  Review of Systems  Positive:  Negative:   Physical Exam  BP 111/67 (BP Location: Left Arm)   Pulse 73   Temp 98.3 F (36.8 C) (Oral)   Resp 18   Ht 4\' 11"  (1.499 m)   Wt 65.8 kg   SpO2 100%   BMI 29.29 kg/m  Gen:   Awake, no distress   Resp:  Normal effort  MSK:   Moves extremities without difficulty  Other:    Medical Decision Making  Medically screening exam initiated at 12:39 PM.  Appropriate orders placed.  Anaaya Rayos was informed that the remainder of the evaluation will be completed by another provider, this initial triage assessment does not replace that evaluation, and the importance of remaining in the ED until their evaluation is complete.     Blenda Peals, PA-C 08/09/22 1241

## 2022-08-09 NOTE — Discharge Instructions (Signed)
I have given you referral to neurology.  They should contact you in the coming days to schedule an appointment.  If they do not have also provided number she can call them.  Please return to the emergency department for any worsening symptoms you might have.

## 2022-08-09 NOTE — ED Triage Notes (Signed)
Patient reports having a headache x 2 days. Patient reports a history of the same. Patient also c/o dizziness, nausea, and light sensitivity. Patient denies blurred vision.

## 2022-08-09 NOTE — ED Provider Notes (Signed)
[facility]-EMERGENCY DEPT Provider Note   CSN: [id number] Arrival date & time: 08/09/22  1139     History Chief Complaint  Patient presents with   Headache    Carmen Wheeler is a 36 y.o. female patient with history of migraines who presents to the emergency department today for further evaluation of a headache that has been ongoing for 2 days.  Headache has been constant and worsening.  She describes her headache as a pressure sensation.  It is global in nature.  She overall feels anxious, nauseous.  She denies any visual disturbances, focal weakness, focal numbness, fever, chills, cough, congestion.  He denies any vomiting, trouble talking, trouble walking.  She states that this feels identical to her previous headaches and does not feel any different.  She has been taking her home medications without relief.  She is not currently on any preventative medications.   Headache      Home Medications Prior to Admission medications   Medication Sig Start Date End Date Taking? Authorizing Provider  acetaminophen (TYLENOL) 325 MG tablet Take 650 mg by mouth every 6 (six) hours as needed for headache.    [provider]  FEROSUL 325 (65 Fe) MG tablet Take 1 tablet (325 mg total) by mouth 2 (two) times daily with a meal. Patient not taking: Reported on 09/15/2019 06/11/18   Prothero, Henderson Newcomer, CNM  ibuprofen (ADVIL,MOTRIN) 600 MG tablet Take 1 tablet (600 mg total) by mouth every 6 (six) hours. Patient not taking: Reported on 03/21/2020 06/11/18   Prothero, Henderson Newcomer, CNM  methocarbamol (ROBAXIN) 500 MG tablet Take 1 tablet (500 mg total) by mouth 2 (two) times daily. 01/12/22   Prosperi, Christian H, PA-C  oxyCODONE (OXY IR/ROXICODONE) 5 MG immediate release tablet Take 1 tablet (5 mg total) by mouth every 4 (four) hours as needed (pain scale 4-7). Patient not taking: Reported on 09/15/2019 06/11/18   Prothero, Henderson Newcomer, CNM      Allergies    Patient has  no known allergies.    Review of Systems   Review of Systems  Neurological:  Positive for headaches.  All other systems reviewed and are negative.   Physical Exam Updated Vital Signs BP 115/70   Pulse 77   Temp 98.2 F (36.8 C)   Resp 17   Ht 4\' 11"  (1.499 m)   Wt 65.8 kg   SpO2 100%   BMI 29.29 kg/m  Physical Exam Vitals and nursing note reviewed.  Constitutional:      Appearance: Normal appearance.  HENT:     Head: Normocephalic and atraumatic.  Eyes:     General:        Right eye: No discharge.        Left eye: No discharge.     Conjunctiva/sclera: Conjunctivae normal.  Pulmonary:     Effort: Pulmonary effort is normal.  Skin:    General: Skin is warm and dry.     Findings: No rash.  Neurological:     General: No focal deficit present.     Mental Status: She is alert.     Comments: Cranial nerves II through XII are intact.  5/5 strength to the upper and lower extremities.  Normal sensation to the upper and lower extremities.  Normal finger-to-nose without evidence of dysmetria.  Extraocular movements are intact without any visual nystagmus.  No pronator drift.  Speech is normal.  Psychiatric:        Mood and Affect: Mood normal.  Behavior: Behavior normal.     ED Results / Procedures / Treatments   Labs (all labs ordered are listed, but only abnormal results are displayed) Labs Reviewed  PREGNANCY, URINE    EKG None  Radiology No results found.  Procedures Procedures    Medications Ordered in ED Medications  diphenhydrAMINE (BENADRYL) capsule 25 mg (25 mg Oral Given 08/09/22 1604)  sodium chloride 0.9 % bolus 1,000 mL (0 mLs Intravenous Stopped 08/09/22 1707)  prochlorperazine (COMPAZINE) injection 10 mg (10 mg Intravenous Given 08/09/22 1605)  ketorolac (TORADOL) 15 MG/ML injection 15 mg (15 mg Intravenous Given 08/09/22 1605)    ED Course/ Medical Decision Making/ A&P Clinical Course as of 08/09/22 1722  Thu Aug 09, 2022  1720 On  reevaluation, patient states her headache has greatly improved after medications and fluids.  We discussed that I will refer her to neurology as she gets these migraines on a weekly basis they are impacting her daily living. [CF]    Clinical Course User Index [CF] Hendricks Limes, PA-C                           Medical Decision Making Huxley Maracle is a 36 y.o. female patient who presents to the emergency department today for further evaluation of global headache.  This seems consistent with her migraine.  Neuroexam is completely normal.  I have a low suspicion for stroke or TIA at this time.  Will plan to give her Compazine, Toradol, Benadryl, and some fluids.  I will plan to reassess once she received the medications.  I will place a referral to neurology as highlighted in the ED course for suspected migraines.  Patient feeling better.  She is safe for discharge at this time.  Risk Prescription drug management.    Final Clinical Impression(s) / ED Diagnoses Final diagnoses:  Acute nonintractable headache, unspecified headache type    Rx / DC Orders ED Discharge Orders          Ordered    Ambulatory referral to Neurology       Comments: An appointment is requested in approximately: 2 weeks   08/09/22 1722              Myna Bright Ritchey, Hershal Coria 08/09/22 1722    Ezequiel Essex, MD 08/09/22 2358

## 2022-09-25 ENCOUNTER — Telehealth: Payer: Self-pay

## 2022-09-25 NOTE — Telephone Encounter (Addendum)
Attempted to call patient at home phone, voice mail set up for a business. Telephoned mobile number, [phone], number not in service at this time. [facility] unable to leave a voice mail.  Telephoned patient at [phone], patient stated she no longer has pain in her breast. Advised patient to contact [facility] Department with this updated information. Patient declined scheduling appointment with [facility] at this time.

## 2022-10-03 ENCOUNTER — Encounter: Payer: Self-pay | Admitting: Psychiatry

## 2022-10-03 ENCOUNTER — Ambulatory Visit (INDEPENDENT_AMBULATORY_CARE_PROVIDER_SITE_OTHER): Payer: 59 | Admitting: Psychiatry

## 2022-10-03 VITALS — BP 98/66 | HR 82 | Ht 59.0 in | Wt 140.4 lb

## 2022-10-03 DIAGNOSIS — G43009 Migraine without aura, not intractable, without status migrainosus: Secondary | ICD-10-CM | POA: Diagnosis not present

## 2022-10-03 MED ORDER — AMITRIPTYLINE HCL 25 MG PO TABS: ORAL_TABLET | ORAL | 6 refills | Status: DC

## 2022-10-03 MED ORDER — RIZATRIPTAN BENZOATE 10 MG PO TABS: 10.0000 mg | ORAL_TABLET | ORAL | 6 refills | Status: DC | PRN

## 2022-10-03 NOTE — Patient Instructions (Addendum)
Start rizatriptan (Maxalt) as needed for migraines. Take at the onset of migraine. If headache recurs or does not fully resolve, you may take a second dose after 2 hours. Please avoid taking more than 2 days per week to avoid rebound headaches  Start amitriptyline at bedtime for headache prevention. Take 1/2 pill at bedtime for one week, then increase to 1 pill at bedtime  ----- GENERAL HEADACHE INFORMATION Headache Preventive Treatment: Please keep in mind that it takes 4-6 weeks for the medication to start working well and 2-3 months at the appropriate dose before deciding if it will be useful or not. If it is not helping at all by this time, then we will discuss other medications to try. Supplements may take 3-6 months until you see full effect.   Natural supplements: Magnesium Oxide or Magnesium Glycinate 500 mg at bed (up to 800 mg daily) Coenzyme Q10 300 mg in AM Vitamin B2- 200 mg twice a day  Add 1 supplement at a time since even natural supplements can have undesirable side effects. You can sometimes buy supplements cheaper (especially Coenzyme Q10) at www.https://compton-perez.com/ or at [employer].  Vitamins and herbs that show potential:  Magnesium: Magnesium (250 mg twice a day or 500 mg at bed) has a relaxant effect on smooth muscles such as blood vessels. Individuals suffering from frequent or daily headache usually have low magnesium levels which can be increase with daily supplementation of 400-750 mg. Three trials found 40-90% average headache reduction  when used as a preventative. Magnesium also demonstrated the benefit in menstrually related migraine.  Magnesium is part of the messenger system in the serotonin cascade and it is a good muscle relaxant.  It is also useful for constipation which can be a side effect of other medications used to treat migraine. Good sources include nuts, whole grains, and tomatoes. Side Effects: loose stool/diarrhea Riboflavin (vitamin B 2) 200 mg twice a day. This  vitamin assists nerve cells in the production of ATP a principal energy storing molecule.  It is necessary for many chemical reactions in the body.  There have been at least 3 clinical trials of riboflavin using 400 mg per day all of which suggested that migraine frequency can be decreased.  All 3 trials showed significant improvement in over half of migraine sufferers.  The supplement is found in bread, cereal, milk, meat, and poultry.  Most Americans get more riboflavin than the recommended daily allowance, however riboflavin deficiency is not necessary for the supplements to help prevent headache. Side effects: energizing, green urine  Coenzyme Q10: This is present in almost all cells in the body and is critical component for the conversion of energy.  Recent studies have shown that a nutritional supplement of CoQ10 can reduce the frequency of migraine attacks by improving the energy production of cells as with riboflavin.  Doses of 150 mg twice a day have been shown to be effective.  Melatonin: Increasing evidence shows correlation between melatonin secretion and headache conditions.  Melatonin supplementation has decreased headache intensity and duration.  It is widely used as a sleep aid.  Sleep is natures way of dealing with migraine.  A dose of 3 mg is recommended to start for headaches including cluster headache. Higher doses up to 15 mg has been reviewed for use in Cluster headache and have been used. The rationale behind using melatonin for cluster is that many theories regarding the cause of Cluster headache center around the disruption of the normal circadian rhythm in  the brain.  This helps restore the normal circadian rhythm.  Ginger: Ginger has a small amount of antihistamine and anti-inflammatory action which may help headache.  It is primarily used for nausea and may aid in the absorption of other medications. HEADACHE DIET: Foods and beverages which may trigger migraine Note that only 20%  of headache patients are food sensitive. You will know if you are food sensitive if you get a headache consistently 20 minutes to 2 hours after eating a certain food. Only cut out a food if it causes headaches, otherwise you might remove foods you enjoy! What matters most for diet is to eat a well balanced healthy diet full of vegetables and low fat protein, and to not miss meals.  Chocolate, other sweets ALL cheeses except cottage and cream cheese Dairy products, yogurt, sour cream, ice cream Liver Meat extracts (Bovril, Marmite, meat tenderizers) Meats or fish which have undergone aging, fermenting, pickling or smoking. These include: Hotdogs,salami,Lox,sausage, mortadellas,smoked salmon, pepperoni, Pickled herring Pods of broad bean (English beans, Chinese pea pods, [place] (fava) beans, lima and navy beans Ripe avocado, ripe banana Yeast extracts or active yeast preparations such as Brewer's or Fleishman's (commercial bakes goods are permitted) Tomato based foods, pizza (lasagna, etc.)  MSG (monosodium glutamate) is disguised as many things; look for these common aliases: Monopotassium glutamate Autolysed yeast Hydrolysed protein Sodium caseinate "flavorings" "all natural preservatives" Nutrasweet  Avoid all other foods that convincingly provoke headaches.  Resources: The Dizzy Lu Duffel Your Headache Diet, migrainestrong.com  https://www.aguirre.org/  Caffeine and Migraine For patients that have migraine, caffeine intake more than 3 days per week can lead to dependency and increased migraine frequency. I would recommend cutting back on your caffeine intake as best you can. The recommended amount of caffeine is 200-300 mg daily, although migraine patients may experience dependency at even lower doses. While you may notice an increase in headache temporarily, cutting back will be helpful for headaches in the long run. For more  information on caffeine and migraine, visit: https://americanmigrainefoundation.org/resource-library/caffeine-and-migraine/  Headache Prevention Strategies:  1. Maintain a headache diary; learn to identify and avoid triggers.  - This can be a simple note where you log when you had a headache, associated symptoms, and medications used - There are several smartphone apps developed to help track migraines: Migraine Buddy, Migraine Monitor, Curelator N1-Headache App  Common triggers include: Emotional triggers: Emotional/Upset family or friends Emotional/Upset occupation Business reversal/success Anticipation anxiety Crisis-serious Post-crisis periodNew job/position   Physical triggers: Vacation Day Weekend Strenuous Exercise High Altitude Location New Move Menstrual Day Physical Illness Oversleep/Not enough sleep Weather changes Light: Photophobia or light sesnitivity treatment involves a balance between desensitization and reduction in overly strong input. Use dark polarized glasses outside, but not inside. Avoid bright or fluorescent light, but do not dim environment to the point that going into a normally lit room hurts. Consider FL-41 tint lenses, which reduce the most irritating wavelengths without blocking too much light.  These can be obtained at axonoptics.com or theraspecs.com Foods: see list above.  2. Limit use of acute treatments (over-the-counter medications, triptans, etc.) to no more than 2 days per week or 10 days per month to prevent medication overuse headache (rebound headache).    3. Follow a regular schedule (including weekends and holidays): Don't skip meals. Eat a balanced diet. 8 hours of sleep nightly. Minimize stress. Exercise 30 minutes per day. Being overweight is associated with a 5 times increased risk of chronic migraine. Keep well hydrated and drink  6-8 glasses of water per day.  4. Initiate non-pharmacologic measures at the earliest onset of your  headache. Rest and quiet environment. Relax and reduce stress. Breathe2Relax is a free app that can instruct you on    some simple relaxtion and breathing techniques. Http://Dawnbuse.com is a    free website that provides teaching videos on relaxation.  Also, there are  many apps that   can be downloaded for "mindful" relaxation.  An app called YOGA NIDRA will help walk you through mindfulness. Another app called Calm can be downloaded to give you a structured mindfulness guide with daily reminders and skill development. Headspace for guided meditation Mindfulness Based Stress Reduction Online Course: www.palousemindfulness.com Cold compresses.  5. Don't wait!! Take the maximum allowable dosage of prescribed medication at the first sign of migraine.  6. Compliance:  Take prescribed medication regularly as directed and at the first sign of a migraine.  7. Communicate:  Call your physician when problems arise, especially if your headaches change, increase in frequency/severity, or become associated with neurological symptoms (weakness, numbness, slurred speech, etc.).  8. Headache/pain management therapies: Consider various complementary methods, including medication, behavioral therapy, psychological counselling, biofeedback, massage therapy, acupuncture, dry needling, and other modalities.  Such measures may reduce the need for medications. Counseling for pain management, where patients learn to function and ignore/minimize their pain, seems to work very well.  9. Recommend changing family's attention and focus away from patient's headaches. Instead, emphasize daily activities. If first question of day is 'How are your headaches/Do you have a headache today?', then patient will constantly think about headaches, thus making them worse. Goal is to re-direct attention away from headaches, toward daily activities and other distractions.  10. Helpful  Websites: www.AmericanHeadacheSociety.org VoipObserver.it www.headaches.org GolfingFamily.no www.achenet.org

## 2022-10-03 NOTE — Progress Notes (Signed)
Referring:  Hendricks Limes, PA-C [address],  [place] [id number]  PCP: Pcp, No  Neurology was asked to evaluate Carmen Wheeler, a 37 year old female for a chief complaint of headaches. Our recommendations of care will be communicated by shared medical record.    CC:  headaches  History provided from self  HPI:  Medical co-morbidities: none  The patient presents for evaluation of headaches which began several years ago. They have worsened over the past few years. She currently has 2-3 headaches per week. They are associated with photophobia, phonophobia, and nausea. Headaches can last 2-3 days at a time.  She has tried Excedrin and Tylenol which were ineffective so she currently isn't taking anything.  Headache History: Onset: several years ago Aura: blurry vision Location: frontal Associated Symptoms:  Photophobia: yes  Phonophobia: yes  Nausea: yes Worse with activity?: yes Duration of headaches: 2-3 days  Migraine days per month: 12 Headache free days per month: 18  Current Treatment: Abortive none  Preventative none  Prior Therapies                                 Excedrin Tylenol   LABS: CBC    Component Value Date/Time   WBC 17.3 (H) 06/09/2018 0544   RBC 2.59 (L) 06/09/2018 0544   HGB 8.2 (L) 06/09/2018 0544   HCT 23.0 (L) 06/09/2018 0544   PLT 160 06/09/2018 0544   MCV 88.8 06/09/2018 0544   MCH 31.7 06/09/2018 0544   MCHC 35.7 06/09/2018 0544   RDW 14.8 06/09/2018 0544   LYMPHSABS 2.0 08/25/2017 1255   MONOABS 0.4 08/25/2017 1255   EOSABS 0.1 08/25/2017 1255   BASOSABS 0.0 08/25/2017 1255      Latest Ref Rng & Units 08/25/2017   12:55 PM 03/12/2015    5:17 PM  CMP  Glucose 65 - 99 mg/dL 94  91   BUN 6 - 20 mg/dL 7  13   Creatinine 0.44 - 1.00 mg/dL 0.50  0.59   Sodium 135 - 145 mmol/L 139  139   Potassium 3.5 - 5.1 mmol/L 3.4  3.6   Chloride 101 - 111 mmol/L 106  107   CO2 22 - 32 mmol/L 25  23   Calcium 8.9 -  10.3 mg/dL 8.8  9.2   Total Protein 6.5 - 8.1 g/dL 5.7    Total Bilirubin 0.3 - 1.2 mg/dL 0.4    Alkaline Phos 38 - 126 U/L 48    AST 15 - 41 U/L 20    ALT 14 - 54 U/L 17       IMAGING:  none  Current Outpatient Medications on File Prior to Visit  Medication Sig Dispense Refill   acetaminophen (TYLENOL) 325 MG tablet Take 650 mg by mouth every 6 (six) hours as needed for headache. (Patient not taking: Reported on 10/03/2022)     FEROSUL 325 (65 Fe) MG tablet Take 1 tablet (325 mg total) by mouth 2 (two) times daily with a meal. (Patient not taking: Reported on 09/15/2019) 60 tablet 2   ibuprofen (ADVIL,MOTRIN) 600 MG tablet Take 1 tablet (600 mg total) by mouth every 6 (six) hours. (Patient not taking: Reported on 03/21/2020) 30 tablet 0   methocarbamol (ROBAXIN) 500 MG tablet Take 1 tablet (500 mg total) by mouth 2 (two) times daily. (Patient not taking: Reported on 10/03/2022) 20 tablet 0   oxyCODONE (OXY  IR/ROXICODONE) 5 MG immediate release tablet Take 1 tablet (5 mg total) by mouth every 4 (four) hours as needed (pain scale 4-7). (Patient not taking: Reported on 09/15/2019) 30 tablet 0   No current facility-administered medications on file prior to visit.     Allergies: No Known Allergies  Family History: Migraine or other headaches in the family:  brother has migraines Aneurysms in a first degree relative:  no Brain tumors in the family:  no Other neurological illness in the family:   no  Past Medical History: Past Medical History:  Diagnosis Date   Anemia    Migraine    Renal disorder     Past Surgical History Past Surgical History:  Procedure Laterality Date   CESAREAN SECTION     CESAREAN SECTION N/A 06/08/2018   Procedure: REPEAT CESAREAN SECTION;  Surgeon: Everett Graff, MD;  Location: [facility];  Service: Obstetrics;  Laterality: N/A;  RNFA Requested  Tracey   WISDOM TOOTH EXTRACTION      Social History: Social History   Tobacco Use   Smoking  status: Never   Smokeless tobacco: Never  Vaping Use   Vaping Use: Never used  Substance Use Topics   Alcohol use: No   Drug use: No    ROS: Negative for fevers, chills. Positive for headaches. All other systems reviewed and negative unless stated otherwise in HPI.   Physical Exam:   Vital Signs: BP 98/66 (BP Location: Left Arm, Patient Position: Sitting, Cuff Size: Normal)   Pulse 82   Ht 4' 11"$  (1.499 m)   Wt 140 lb 6.4 oz (63.7 kg)   BMI 28.36 kg/m  GENERAL: well appearing,in no acute distress,alert SKIN:  Color, texture, turgor normal. No rashes or lesions HEAD:  Normocephalic/atraumatic. CV:  RRR RESP: Normal respiratory effort MSK: no tenderness to palpation over occiput, neck, or shoulders  NEUROLOGICAL: Mental Status: Alert, oriented to person, place and time,Follows commands Cranial Nerves: PERRL, visual fields intact to confrontation, extraocular movements intact, facial sensation intact, no facial droop or ptosis, hearing grossly intact, no dysarthria Motor: muscle strength 5/5 both upper and lower extremities Reflexes: 2+ throughout Sensation: intact to light touch all 4 extremities Coordination: Finger-to- nose-finger intact bilaterally Gait: normal-based   IMPRESSION: 37 year old female who presents for evaluation of headaches. Her neurological exam today is normal. Headache pattern is most consistent with migraine without aura. She would like to start a preventive today. Will start amitriptyline for migraine prevention and Maxalt for rescue.  PLAN: -Prevention: Start amitriptyline 12.5 mg QHS x1 week, then increase to 25 mg QHS -Rescue: Start Maxalt 10 mg PRN   I spent a total of 20 minutes chart reviewing and counseling the patient. Headache education was done. Discussed treatment options including preventive and acute medications. Discussed medication side effects, adverse reactions and drug interactions. Written educational materials and patient  instructions outlining all of the above were given.  Follow-up: 6 months   Genia Harold, MD 10/03/2022   2:56 PM

## 2022-11-22 ENCOUNTER — Other Ambulatory Visit: Payer: Self-pay | Admitting: Psychiatry

## 2023-02-06 ENCOUNTER — Telehealth: Payer: Self-pay | Admitting: Psychiatry

## 2023-02-06 ENCOUNTER — Ambulatory Visit (INDEPENDENT_AMBULATORY_CARE_PROVIDER_SITE_OTHER): Payer: 59 | Admitting: Neurology

## 2023-02-06 DIAGNOSIS — G43009 Migraine without aura, not intractable, without status migrainosus: Secondary | ICD-10-CM | POA: Diagnosis not present

## 2023-02-06 MED ORDER — KETOROLAC TROMETHAMINE 60 MG/2ML IM SOLN
60.0000 mg | Freq: Once | INTRAMUSCULAR | Status: AC
  Administered 2023-02-06: 60 mg via INTRAMUSCULAR

## 2023-02-06 NOTE — Telephone Encounter (Signed)
Pt said rizatriptan (MAXALT) 10 MG tablet was working but now   have had a headache since June 15. Would like a call back.

## 2023-02-06 NOTE — Progress Notes (Unsigned)
Nurse injection visit  Verbal order given by Dr.Ahern to give 60 mg for migraine headache

## 2023-02-06 NOTE — Telephone Encounter (Signed)
I reviewed Dr. Quentin Mulling notes. She did not specify next step after amitriptyline and say her 4 months ago. We can have her in the office for a toradol shot and see if that helps. Dr. Delena Bali documented she has not tried any other preventatives. If she failed amitriptyline she likely needs another preventative. Looking through her med list doesn't appear she has tried any other preventatives. She needs an appointment to discuss starting a different medication. Can she come today for a toradol shot? If not, has she ever tried a few days of steroids which we do often or have any contraindications to steroids? We could do a medrol dosepak. And she needs a f/u to discuss other medications, an NP would be ok. Let m ekno what happens

## 2023-02-06 NOTE — Telephone Encounter (Signed)
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 Pt said she was not able to take amitriptyline 25 mg tablets daily, states it caused severe dry mouth so she stopped pills after taking for 2 weeks. Headaches does not cause any vision problems, reports feeling tired and fatigue. Pt asked if you have any recommendations to help get rid of headache?  Please advise

## 2023-02-06 NOTE — Telephone Encounter (Signed)
Pt said she can be here at 4:15pm to get Toradol shot.

## 2023-02-07 NOTE — Telephone Encounter (Signed)
Pt came and received Toradol injection on 02/06/23.  60 mg dose given per your request via teams.   Pt has follow up visit scheduled with Megan on 02/11/23 to discuss medication options.  Just FYI

## 2023-02-11 ENCOUNTER — Ambulatory Visit (INDEPENDENT_AMBULATORY_CARE_PROVIDER_SITE_OTHER): Payer: 59 | Admitting: Adult Health

## 2023-02-11 ENCOUNTER — Encounter: Payer: Self-pay | Admitting: Adult Health

## 2023-02-11 VITALS — BP 103/63 | HR 79 | Ht 59.0 in | Wt 145.0 lb

## 2023-02-11 DIAGNOSIS — G43009 Migraine without aura, not intractable, without status migrainosus: Secondary | ICD-10-CM | POA: Diagnosis not present

## 2023-02-11 MED ORDER — TOPIRAMATE 50 MG PO TABS: 25.0000 mg | ORAL_TABLET | Freq: Every day | ORAL | 5 refills | Status: DC

## 2023-02-11 NOTE — Patient Instructions (Signed)
Your Plan:  Start Topamax 25 mg at bedtime.  Continue rizatriptan. Take as soon as you feel migraine. Can repeat in 2 hours if needed. Only 2 tablets in 24hours.  If your symptoms worsen or you develop new symptoms please let us know.    Thank you for coming to see [place] at [facility]. I hope we have been able to provide you high quality care today.  You may receive a patient satisfaction survey over the next few weeks. We would appreciate your feedback and comments so that we may continue to improve ourselves and the health of our patients.

## 2023-02-11 NOTE — Progress Notes (Signed)
PATIENT: Carmen Wheeler DOB: [birth year]  REASON FOR VISIT: follow up HISTORY FROM: patient PRIMARY NEUROLOGIST: Dr. Delena Bali  Chief Complaint  Patient presents with   Follow-up    Pt in 8 with son  Pt here for increased migraines Pt states 9 migraines in last month Pt states migraine pain starts in forehead and travels to back of head      HISTORY OF PRESENT ILLNESS: Today 02/11/23  Carmen Wheeler is a 37 y.o. female who has been followed in this office for Migraine headaches. Returns today for follow-up. '  She reports that her headache frequency has increased.  She was unable to tolerate amitriptyline due to dry mouth.  She is having approximately 9 headache days per month.  Typically rizatriptan will resolve her headache within the hour.  She did have a recent headache and rizatriptan did not work.  She has the Mirena IUD.  States that she is not breast-feeding.   Tried and failed medication: Amitriptyline (dry mouth)   HISTORY Medical co-morbidities: none   The patient presents for evaluation of headaches which began several years ago. They have worsened over the past few years. She currently has 2-3 headaches per week. They are associated with photophobia, phonophobia, and nausea. Headaches can last 2-3 days at a time.   She has tried Excedrin and Tylenol which were ineffective so she currently isn't taking anything.   Headache History: Onset: several years ago Aura: blurry vision Location: frontal Associated Symptoms:             Photophobia: yes             Phonophobia: yes             Nausea: yes Worse with activity?: yes Duration of headaches: 2-3 days   Migraine days per month: 12 Headache free days per month: 18   Current Treatment: Abortive none   Preventative none   Prior Therapies                                 Excedrin Tylenol  REVIEW OF SYSTEMS: Out of a complete 14 system review of symptoms, the patient complains only of the  following symptoms, and all other reviewed systems are negative.  ALLERGIES: No Known Allergies  HOME MEDICATIONS: Outpatient Medications Prior to Visit  Medication Sig Dispense Refill   rizatriptan (MAXALT) 10 MG tablet Take 1 tablet (10 mg total) by mouth as needed for migraine. May repeat in 2 hours if needed. Max dose 2 pills in 24 hours 10 tablet 6   acetaminophen (TYLENOL) 325 MG tablet Take 650 mg by mouth every 6 (six) hours as needed for headache.     amitriptyline (ELAVIL) 25 MG tablet TAKE 1/2 PILL AT BEDTIME FOR ONE WEEK, THEN INCREASE TO 1 PILL AT BEDTIME 90 tablet 2   FEROSUL 325 (65 Fe) MG tablet Take 1 tablet (325 mg total) by mouth 2 (two) times daily with a meal. 60 tablet 2   ibuprofen (ADVIL,MOTRIN) 600 MG tablet Take 1 tablet (600 mg total) by mouth every 6 (six) hours. 30 tablet 0   methocarbamol (ROBAXIN) 500 MG tablet Take 1 tablet (500 mg total) by mouth 2 (two) times daily. 20 tablet 0   oxyCODONE (OXY IR/ROXICODONE) 5 MG immediate release tablet Take 1 tablet (5 mg total) by mouth every 4 (four) hours as needed (pain scale 4-7). 30 tablet 0   No  facility-administered medications prior to visit.    PAST MEDICAL HISTORY: Past Medical History:  Diagnosis Date   Anemia    Migraine    Renal disorder     PAST SURGICAL HISTORY: Past Surgical History:  Procedure Laterality Date   CESAREAN SECTION     CESAREAN SECTION N/A 06/08/2018   Procedure: REPEAT CESAREAN SECTION;  Surgeon: Osborn Coho, MD;  Location: [facility] BIRTHING SUITES;  Service: Obstetrics;  Laterality: N/A;  RNFA Requested  Tracey   WISDOM TOOTH EXTRACTION      FAMILY HISTORY: Family History  Problem Relation Age of Onset   Healthy Mother    Healthy Father    Migraines Brother     SOCIAL HISTORY: Social History   Socioeconomic History   Marital status: Married    Spouse name: Not on file   Number of children: Not on file   Years of education: Not on file   Highest education level: Not  on file  Occupational History   Not on file  Tobacco Use   Smoking status: Never   Smokeless tobacco: Never  Vaping Use   Vaping Use: Never used  Substance and Sexual Activity   Alcohol use: No   Drug use: No   Sexual activity: Yes  Other Topics Concern   Not on file  Social History Narrative   Not on file   Social Determinants of Health   Financial Resource Strain: Low Risk  (05/26/2018)   Overall Financial Resource Strain (CARDIA)    Difficulty of Paying Living Expenses: Not hard at all  Food Insecurity: No Food Insecurity (05/26/2018)   Hunger Vital Sign    Worried About Running Out of Food in the Last Year: Never true    Ran Out of Food in the Last Year: Never true  Transportation Needs: Unknown (05/26/2018)   PRAPARE - Administrator, Civil Service (Medical): No    Lack of Transportation (Non-Medical): Not on file  Physical Activity: Not on file  Stress: No Stress Concern Present (05/26/2018)   [employer] of Occupational Health - Occupational Stress Questionnaire    Feeling of Stress : Not at all  Social Connections: Not on file  Intimate Partner Violence: Not At Risk (05/26/2018)   Humiliation, Afraid, Rape, and Kick questionnaire    Fear of Current or Ex-Partner: No    Emotionally Abused: No    Physically Abused: No    Sexually Abused: No      PHYSICAL EXAM  Vitals:   02/11/23 1003  BP: 103/63  Pulse: 79  Weight: 145 lb (65.8 kg)  Height: 4\' 11"  (1.499 m)   Body mass index is 29.29 kg/m.  Generalized: Well developed, in no acute distress   Neurological examination  Mentation: Alert oriented to time, place, history taking. Follows all commands speech and language fluent Cranial nerve II-XII: Pupils were equal round reactive to light. Extraocular movements were full, visual field were full on confrontational test. Facial sensation and strength were normal. . Head turning and shoulder shrug  were normal and symmetric. Motor: The  motor testing reveals 5 over 5 strength of all 4 extremities. Good symmetric motor tone is noted throughout.  Sensory: Sensory testing is intact to soft touch on all 4 extremities. No evidence of extinction is noted.  Coordination: Cerebellar testing reveals good finger-nose-finger and heel-to-shin bilaterally.  Gait and station: Gait is normal.  Reflexes: Deep tendon reflexes are symmetric and normal bilaterally.   DIAGNOSTIC DATA (LABS, IMAGING, TESTING) - I  reviewed patient records, labs, notes, testing and imaging myself where available.  Lab Results  Component Value Date   WBC 17.3 (H) 06/09/2018   HGB 8.2 (L) 06/09/2018   HCT 23.0 (L) 06/09/2018   MCV 88.8 06/09/2018   PLT 160 06/09/2018      Component Value Date/Time   NA 139 08/25/2017 1255   K 3.4 (L) 08/25/2017 1255   CL 106 08/25/2017 1255   CO2 25 08/25/2017 1255   GLUCOSE 94 08/25/2017 1255   BUN 7 08/25/2017 1255   CREATININE 0.50 08/25/2017 1255   CALCIUM 8.8 (L) 08/25/2017 1255   PROT 5.7 (L) 08/25/2017 1255   ALBUMIN 3.5 08/25/2017 1255   AST 20 08/25/2017 1255   ALT 17 08/25/2017 1255   ALKPHOS 48 08/25/2017 1255   BILITOT 0.4 08/25/2017 1255   GFRNONAA >60 08/25/2017 1255   GFRAA >60 08/25/2017 1255      ASSESSMENT AND PLAN 37 y.o. year old female  has a past medical history of Anemia, Migraine, and Renal disorder. here with:  1.  Migraine headache  Start Topamax 25 mg at bedtime.  Reviewed potential side effects with the patient and provided her information on her after visit summary. Continue rizatriptan for abortive therapy.  Advised to take at the onset of a migraine, can repeat in 2 hours if needed.  No more than 2 tablets in 24 hours Follow-up in 6-7 months or sooner if needed     Butch Penny, MSN, NP-C 02/11/2023, 10:30 AM [facility] [address] [place], [place] [address] [phone]

## 2023-03-12 ENCOUNTER — Ambulatory Visit (INDEPENDENT_AMBULATORY_CARE_PROVIDER_SITE_OTHER): Payer: 59 | Admitting: Family Medicine

## 2023-04-09 ENCOUNTER — Other Ambulatory Visit (HOSPITAL_COMMUNITY): Payer: Self-pay | Admitting: Family

## 2023-04-09 DIAGNOSIS — R928 Other abnormal and inconclusive findings on diagnostic imaging of breast: Secondary | ICD-10-CM

## 2023-04-10 ENCOUNTER — Telehealth: Payer: Self-pay | Admitting: Adult Health

## 2023-04-10 NOTE — Telephone Encounter (Signed)
Pt said topiramate (TOPAMAX) 50 MG tablet made me really tired, mood swings really weird; stop taking 2 weeks ago. Would like a call back.

## 2023-04-10 NOTE — Telephone Encounter (Signed)
I spoke with the patient to discuss her symptoms. She began taking topiramate 25 mg on February 12, 2023. She began to notice side effects such as constant fatigue and blurry vision. She reports she had unusual mood swings where she had no motivation and some anger. She discontinued taking the medication about two weeks ago and she is no longer having blurry vision and her energy levels have returned to normal.  She also discontinued amitriptyline due to similar side effects. (I updated medication list to reflect change). She continues to take rizatriptan 10 MG PRN for migraine.  She would like an alternative preventive medication. If a medication is called in for the patient [facility] [address], [place], [place] [address].

## 2023-04-11 ENCOUNTER — Ambulatory Visit (INDEPENDENT_AMBULATORY_CARE_PROVIDER_SITE_OTHER): Payer: 59 | Admitting: Family Medicine

## 2023-04-11 NOTE — Telephone Encounter (Signed)
Can we get her in for a virtual visit/in office with me or a visit with Dr. Delena Bali before she leaves to discuss her medications. (She may have a 10:00 opening?)

## 2023-04-22 ENCOUNTER — Other Ambulatory Visit (HOSPITAL_COMMUNITY): Payer: Self-pay | Admitting: Family

## 2023-04-22 DIAGNOSIS — N644 Mastodynia: Secondary | ICD-10-CM

## 2023-04-25 ENCOUNTER — Ambulatory Visit (INDEPENDENT_AMBULATORY_CARE_PROVIDER_SITE_OTHER): Payer: 59 | Admitting: Psychiatry

## 2023-04-25 ENCOUNTER — Encounter: Payer: Self-pay | Admitting: Psychiatry

## 2023-04-25 VITALS — BP 115/75 | HR 92 | Ht 59.0 in | Wt 139.5 lb

## 2023-04-25 DIAGNOSIS — G43009 Migraine without aura, not intractable, without status migrainosus: Secondary | ICD-10-CM

## 2023-04-25 MED ORDER — KETOROLAC TROMETHAMINE 60 MG/2ML IM SOLN
60.0000 mg | Freq: Once | INTRAMUSCULAR | Status: AC
  Administered 2023-04-25: 60 mg via INTRAMUSCULAR

## 2023-04-25 MED ORDER — EMGALITY 120 MG/ML ~~LOC~~ SOAJ: 1.0000 | SUBCUTANEOUS | 6 refills | Status: DC

## 2023-04-25 MED ORDER — EMGALITY 120 MG/ML ~~LOC~~ SOAJ: 2.0000 | Freq: Once | SUBCUTANEOUS | 0 refills | Status: AC

## 2023-04-25 NOTE — Telephone Encounter (Signed)
REQUIRED PHONE NOTE: Pt called to inquire about her being contacted about what medication would replace her topiramate .  Pt has accepted a f/u with Dr Delena Bali for 3:30 this afternoon, she is aware to check in at 3:00, this is FYI to POD 4, no call back requested

## 2023-04-25 NOTE — Progress Notes (Signed)
Verbal order for Toradol 60mg  IM from Dr. Delena Bali.  Under aseptic technique toradol 60mg  IM given to R upper outer quadrant.  Bandaid applied. To check out. Tolerated well.

## 2023-04-25 NOTE — Progress Notes (Signed)
   CC:  headaches  Follow-up Visit  Last visit: 02/11/23  Brief HPI: 37 year old female without significant medical history who follows in clinic for migraines.  At her last visit she was started on Topamax for migraine prevention. Maxalt was continued for rescue. Interval History: She developed mood swings, fatigue, and blurry vision on Topamax so this was discontinued. She is currently averaging 3-4 migraines per month. Maxalt typically works well for rescue. It makes her sleepy, which she does not mind because it helps her relax.  She woke up with a migraine this morning. Took Maxalt which helped took the edge off but she still has a lingering headache.  Migraine days per month: 4 Headache free days per month: 26  Current Headache Regimen: Preventative: none Abortive: Maxalt 10 mg PRN   Prior Therapies                                  Rescue: Excedrin Tylenol Maxalt 10 mg PRN  Prevention: Amitriptyline 25 mg at bedtime - dry mouth Topamax - fatigue, blurry vision, mood swings   Physical Exam:   Vital Signs: BP 115/75 (BP Location: Right Arm, Patient Position: Sitting, Cuff Size: Normal)   Pulse 92   Ht 4\' 11"  (1.499 m)   Wt 139 lb 8 oz (63.3 kg)   BMI 28.18 kg/m  GENERAL:  well appearing, in no acute distress, alert  SKIN:  Color, texture, turgor normal. No rashes or lesions HEAD:  Normocephalic/atraumatic. RESP: normal respiratory effort MSK:  No gross joint deformities.   NEUROLOGICAL: Mental Status: Alert, oriented to person, place and time, Follows commands, and Speech fluent and appropriate. Cranial Nerves: PERRL, face symmetric, no dysarthria, hearing grossly intact Motor: moves all extremities equally Gait: normal-based.  IMPRESSION: 37 year old female without significant medical history who presents for follow up of migraines. She was unable to tolerate Topamax or amitriptyline due to side effects. Will start Emgality for migraine prevention and  continue Maxalt for rescue. Toradol shot given in the office today for her current migraine.  PLAN: -Toradol shot today -Prevention: Start Emgality 120 mg monthly -Rescue: Continue Maxalt 10 mg PRN -Next steps: consider propranolol or Qulipta for prevention   Follow-up: 6 months  I spent a total of 14 minutes on the date of the service. Headache education was done. Discussed treatment options including preventive and acute medications. Discussed medication side effects, adverse reactions and drug interactions. Written educational materials and patient instructions outlining all of the above were given.  Ocie Doyne, MD 04/25/23 3:07 PM

## 2023-05-07 ENCOUNTER — Ambulatory Visit (HOSPITAL_COMMUNITY)
Admission: RE | Admit: 2023-05-07 | Discharge: 2023-05-07 | Disposition: A | Discharge status: Home / Self Care | Payer: 59 | Source: Ambulatory Visit | Attending: Family | Admitting: Family

## 2023-05-07 ENCOUNTER — Encounter (HOSPITAL_COMMUNITY): Payer: Self-pay

## 2023-05-07 ENCOUNTER — Other Ambulatory Visit (HOSPITAL_COMMUNITY): Payer: Self-pay | Admitting: Family

## 2023-05-07 DIAGNOSIS — R928 Other abnormal and inconclusive findings on diagnostic imaging of breast: Secondary | ICD-10-CM | POA: Insufficient documentation

## 2023-05-07 DIAGNOSIS — N644 Mastodynia: Secondary | ICD-10-CM

## 2023-05-08 ENCOUNTER — Other Ambulatory Visit (HOSPITAL_COMMUNITY): Payer: Self-pay

## 2023-05-08 ENCOUNTER — Telehealth: Payer: Self-pay | Admitting: Psychiatry

## 2023-05-08 ENCOUNTER — Telehealth: Payer: Self-pay

## 2023-05-08 NOTE — Telephone Encounter (Signed)
Pt is asking what will be called in for her since the  Galcanezumab-gnlm [facility]) 120 MG/ML SOAJ was not approved

## 2023-05-08 NOTE — Telephone Encounter (Signed)
Phone room: please call pt to let her know I will send request to PA team to complete PA to see if it is covered  PA team: please help with PA for Emgality, thank you!

## 2023-05-08 NOTE — Telephone Encounter (Signed)
Pharmacy Patient Advocate Encounter   Received notification from Physician's Office that prior authorization for Emgality 120MG /ML auto-injectors (migraine) is required/requested.   Insurance verification completed.   The patient is insured through [facility] .   Per test claim: PA required; PA started via CoverMyMeds. KEY [id number] . Waiting for clinical questions to populate.

## 2023-05-08 NOTE — Telephone Encounter (Signed)
PA request has been Submitted. New Encounter created for follow up. For additional info see Pharmacy Prior Auth telephone encounter from 05/08/2023.

## 2023-05-10 ENCOUNTER — Other Ambulatory Visit (HOSPITAL_COMMUNITY): Payer: Self-pay

## 2023-05-10 NOTE — Telephone Encounter (Signed)
Pharmacy Patient Advocate Encounter  Received notification from [facility] that Prior Authorization for Emgality 120MG /ML auto-injectors (migraine) has been APPROVED from 05/09/23 to 05/08/24. Ran test claim, Copay is $15.00. This test claim was processed through [facility]- copay amounts may vary at other pharmacies due to pharmacy/plan contracts, or as the patient moves through the different stages of their insurance plan.   PA #/Case ID/Reference #: [id number]

## 2023-05-13 ENCOUNTER — Encounter: Payer: Self-pay | Admitting: *Deleted

## 2023-05-13 NOTE — Telephone Encounter (Signed)
Sent mychart message to pt letting her know

## 2023-05-15 ENCOUNTER — Telehealth: Payer: Self-pay | Admitting: Psychiatry

## 2023-05-15 ENCOUNTER — Ambulatory Visit (INDEPENDENT_AMBULATORY_CARE_PROVIDER_SITE_OTHER): Payer: Self-pay | Admitting: *Deleted

## 2023-05-15 DIAGNOSIS — Z0289 Encounter for other administrative examinations: Secondary | ICD-10-CM

## 2023-05-15 NOTE — Telephone Encounter (Signed)
Called pt. She would like to come in to be shown how to do first injection. Scheduled for 1:15pm today.

## 2023-05-15 NOTE — Telephone Encounter (Signed)
Pt is asking if she can come in and be shown how to take her  Galcanezumab-gnlm (EMGALITY) 120 MG/ML SOAJ  please call.

## 2023-05-15 NOTE — Progress Notes (Signed)
Pt here to be shown how to inject first Emgality injection. Pt brought her own medication from pharmacy.  Demonstrated correct injection technique/injection site options. She did 120mg /ml in abdomen/left side and another in abdomen on right side. Pt tolerated injection well.  Provided pt instruction for use packet for Emgality to bring home with her. Pt had no further questions.  Emgality 120mg /mlx2pens Lot: [id number], exp: 01/16/2025, NDC: 4540-9811-91

## 2023-05-20 ENCOUNTER — Ambulatory Visit: Payer: 59 | Admitting: Psychiatry

## 2023-05-29 ENCOUNTER — Ambulatory Visit: Payer: Self-pay | Admitting: Emergency Medicine

## 2023-06-18 ENCOUNTER — Other Ambulatory Visit (HOSPITAL_COMMUNITY): Payer: Self-pay

## 2023-07-16 ENCOUNTER — Telehealth: Payer: Self-pay | Admitting: Psychiatry

## 2023-07-16 NOTE — Telephone Encounter (Signed)
Call to pharmacy about emgality, they state that they didn't need [place] to contact the pharmacy, they are just waiting for shipment and it should be ready for pickup today after 2 pm.  Phone room please update patient

## 2023-07-16 NOTE — Telephone Encounter (Signed)
Pt called stating that her pharmacy informed her that she needed to call the office regarding her Emgality. Pt states that they did not give her any other information but she is thinking that it may need another PA. Please advise.

## 2023-07-31 ENCOUNTER — Telehealth: Payer: Self-pay | Admitting: Psychiatry

## 2023-07-31 MED ORDER — RIZATRIPTAN BENZOATE 10 MG PO TABS: 10.0000 mg | ORAL_TABLET | ORAL | 2 refills | Status: DC | PRN

## 2023-07-31 NOTE — Telephone Encounter (Signed)
Last seen on 04/25/23 Follow up scheduled on 09/16/23 Rx sent

## 2023-07-31 NOTE — Telephone Encounter (Signed)
Pt is requesting a refill for rizatriptan (MAXALT) 10 MG tablet.  [facility] [phone]

## 2023-09-16 ENCOUNTER — Encounter: Payer: Self-pay | Admitting: Adult Health

## 2023-09-16 ENCOUNTER — Ambulatory Visit (INDEPENDENT_AMBULATORY_CARE_PROVIDER_SITE_OTHER): Payer: 59 | Admitting: Adult Health

## 2023-09-16 ENCOUNTER — Telehealth: Payer: Self-pay | Admitting: Adult Health

## 2023-09-16 VITALS — BP 98/67 | HR 68 | Ht 59.0 in | Wt 131.8 lb

## 2023-09-16 DIAGNOSIS — G43009 Migraine without aura, not intractable, without status migrainosus: Secondary | ICD-10-CM

## 2023-09-16 MED ORDER — RIZATRIPTAN BENZOATE 10 MG PO TABS: 10.0000 mg | ORAL_TABLET | ORAL | 11 refills | Status: DC | PRN

## 2023-09-16 MED ORDER — EMGALITY 120 MG/ML ~~LOC~~ SOAJ: 1.0000 | SUBCUTANEOUS | 12 refills | Status: DC

## 2023-09-16 NOTE — Telephone Encounter (Signed)
Called pt and let her know she could see what other pharmacies there are around her that she would be willing to travel to and she could call them to see if [facility] is in stock. If it is, she can ask for her prescription to be transferred there from [facility]. The patient thanked me for the call.

## 2023-09-16 NOTE — Telephone Encounter (Signed)
Patient called stating she just changed pharmacies to [facility] and they just called her and informed her that they are out of stock for emgality and she wants to know what she can do.

## 2023-09-16 NOTE — Progress Notes (Signed)
PATIENT: Lamyia Cdebaca DOB: 1986-03-03  REASON FOR VISIT: follow up HISTORY FROM: patient PRIMARY NEUROLOGIST:   Chief Complaint  Patient presents with   Follow-up    Rm 9, migraine f/u. Doing better.  Sometimes is late with emagality due to pharmacy stataing out of stock.     HISTORY OF PRESENT ILLNESS: Today 09/16/23  Tyana Butzer is a 38 y.o. female who has been followed in this office for migraine headaches. Returns today for follow-up.  She states that she is currently taking Emgality and rizatriptan.  She is only having approximately 1 headache a month.  Reports that when she takes Maxalt she typically goes to sleep and the headache resolves.  She is happy with her current treatment plan.  She returns today for an evaluation.   HISTORY (copied from Dr. Quentin Mulling note) 38 year old female without significant medical history who follows in clinic for migraines.   At her last visit she was started on Topamax for migraine prevention. Maxalt was continued for rescue. Interval History: She developed mood swings, fatigue, and blurry vision on Topamax so this was discontinued. She is currently averaging 3-4 migraines per month. Maxalt typically works well for rescue. It makes her sleepy, which she does not mind because it helps her relax.   She woke up with a migraine this morning. Took Maxalt which helped took the edge off but she still has a lingering headache.   Migraine days per month: 4 Headache free days per month: 26   Current Headache Regimen: Preventative: none Abortive: Maxalt 10 mg PRN     Prior Therapies                                  Rescue: Excedrin Tylenol Maxalt 10 mg PRN   Prevention: Amitriptyline 25 mg at bedtime - dry mouth Topamax - fatigue, blurry vision, mood swings  REVIEW OF SYSTEMS: Out of a complete 14 system review of symptoms, the patient complains only of the following symptoms, and all other reviewed systems are  negative.  ALLERGIES: No Known Allergies  HOME MEDICATIONS: Outpatient Medications Prior to Visit  Medication Sig Dispense Refill   acetaminophen (TYLENOL) 325 MG tablet Take 650 mg by mouth every 6 (six) hours as needed for headache.     FEROSUL 325 (65 Fe) MG tablet Take 1 tablet (325 mg total) by mouth 2 (two) times daily with a meal. 60 tablet 2   Galcanezumab-gnlm (EMGALITY) 120 MG/ML SOAJ Inject 1 Pen into the skin every 30 (thirty) days. 1.12 mL 6   ibuprofen (ADVIL,MOTRIN) 600 MG tablet Take 1 tablet (600 mg total) by mouth every 6 (six) hours. 30 tablet 0   methocarbamol (ROBAXIN) 500 MG tablet Take 1 tablet (500 mg total) by mouth 2 (two) times daily. 20 tablet 0   oxyCODONE (OXY IR/ROXICODONE) 5 MG immediate release tablet Take 1 tablet (5 mg total) by mouth every 4 (four) hours as needed (pain scale 4-7). 30 tablet 0   rizatriptan (MAXALT) 10 MG tablet Take 1 tablet (10 mg total) by mouth as needed for migraine. May repeat in 2 hours if needed. Max dose 2 pills in 24 hours 10 tablet 2   topiramate (TOPAMAX) 50 MG tablet Take 0.5 tablets (25 mg total) by mouth at bedtime. 30 tablet 5   No facility-administered medications prior to visit.    PAST MEDICAL HISTORY: Past Medical History:  Diagnosis Date  Anemia    Migraine    Renal disorder     PAST SURGICAL HISTORY: Past Surgical History:  Procedure Laterality Date   CESAREAN SECTION     CESAREAN SECTION N/A 06/08/2018   Procedure: REPEAT CESAREAN SECTION;  Surgeon: Osborn Coho, MD;  Location: [facility] BIRTHING SU[facility];  Service: Obstetrics;  Laterality: N/A;  RNFA Requested  Tracey   WISDOM TOOTH EXTRACTION      FAMILY HISTORY: Family History  Problem Relation Age of Onset   Healthy Mother    Healthy Father    Migraines Brother     SOCIAL HISTORY: Social History   Socioeconomic History   Marital status: Married    Spouse name: Not on file   Number of children: Not on file   Years of education: Not on file    Highest education level: Not on file  Occupational History   Not on file  Tobacco Use   Smoking status: Never   Smokeless tobacco: Never  Vaping Use   Vaping status: Never Used  Substance and Sexual Activity   Alcohol use: No   Drug use: No   Sexual activity: Yes  Other Topics Concern   Not on file  Social History Narrative   Not on file   Social Drivers of Health   Financial Resource Strain: Low Risk  (05/26/2018)   Overall Financial Resource Strain (CARDIA)    Difficulty of Paying Living Expenses: Not hard at all  Food Insecurity: No Food Insecurity (05/26/2018)   Hunger Vital Sign    Worried About Running Out of Food in the Last Year: Never true    Ran Out of Food in the Last Year: Never true  Transportation Needs: Unknown (05/26/2018)   PRAPARE - Administrator, Civil Service (Medical): No    Lack of Transportation (Non-Medical): Not on file  Physical Activity: Not on file  Stress: No Stress Concern Present (05/26/2018)   [employer] of Occupational Health - Occupational Stress Questionnaire    Feeling of Stress : Not at all  Social Connections: Not on file  Intimate Partner Violence: Not At Risk (05/26/2018)   Humiliation, Afraid, Rape, and Kick questionnaire    Fear of Current or Ex-Partner: No    Emotionally Abused: No    Physically Abused: No    Sexually Abused: No      PHYSICAL EXAM  Vitals:   09/16/23 0957  BP: 98/67  Pulse: 68  Weight: 131 lb 12.8 oz (59.8 kg)  Height: 4\' 11"  (1.499 m)   Body mass index is 26.62 kg/m.  Generalized: Well developed, in no acute distress   Neurological examination  Mentation: Alert oriented to time, place, history taking. Follows all commands speech and language fluent Cranial nerve II-XII: Pupils were equal round reactive to light. Extraocular movements were full, visual field were full on confrontational test. Facial sensation and strength were normal. Uvula tongue midline. Head turning and  shoulder shrug  were normal and symmetric. Motor: The motor testing reveals 5 over 5 strength of all 4 extremities. Good symmetric motor tone is noted throughout.  Sensory: Sensory testing is intact to soft touch on all 4 extremities. No evidence of extinction is noted.  Coordination: Cerebellar testing reveals good finger-nose-finger and heel-to-shin bilaterally.  Gait and station: Gait is normal. Tandem gait is normal. Romberg is negative. No drift is seen.  Reflexes: Deep tendon reflexes are symmetric and normal bilaterally.   DIAGNOSTIC DATA (LABS, IMAGING, TESTING) - I reviewed patient records, labs,  notes, testing and imaging myself where available.  Lab Results  Component Value Date   WBC 17.3 (H) 06/09/2018   HGB 8.2 (L) 06/09/2018   HCT 23.0 (L) 06/09/2018   MCV 88.8 06/09/2018   PLT 160 06/09/2018      Component Value Date/Time   NA 139 08/25/2017 1255   K 3.4 (L) 08/25/2017 1255   CL 106 08/25/2017 1255   CO2 25 08/25/2017 1255   GLUCOSE 94 08/25/2017 1255   BUN 7 08/25/2017 1255   CREATININE 0.50 08/25/2017 1255   CALCIUM 8.8 (L) 08/25/2017 1255   PROT 5.7 (L) 08/25/2017 1255   ALBUMIN 3.5 08/25/2017 1255   AST 20 08/25/2017 1255   ALT 17 08/25/2017 1255   ALKPHOS 48 08/25/2017 1255   BILITOT 0.4 08/25/2017 1255   GFRNONAA >60 08/25/2017 1255   GFRAA >60 08/25/2017 1255     ASSESSMENT AND PLAN 38 y.o. year old female  has a past medical history of Anemia, Migraine, and Renal disorder. here with:  1.  Migraine headache  -Continue Emgality monthly injection -Continue rizatriptan for abortive therapy.  Advised to take 1 tablet at the onset of a migraine and repeat in 2 hours if needed. -Follow-up in 1 year or sooner if needed   Butch Penny, MSN, NP-C 09/16/2023, 10:13 AM [facility] [address] [place], [place] [address] [phone]

## 2023-09-16 NOTE — Patient Instructions (Signed)
Your Plan:  Continue Emgality for prevention Continue Maxalt for abortive therapy If your symptoms worsen or you develop new symptoms please let us know.   Thank you for coming to see [place] at [facility]. I hope we have been able to provide you high quality care today.  You may receive a patient satisfaction survey over the next few weeks. We would appreciate your feedback and comments so that we may continue to improve ourselves and the health of our patients.

## 2023-10-07 ENCOUNTER — Other Ambulatory Visit (HOSPITAL_COMMUNITY): Payer: Self-pay | Admitting: Family

## 2023-10-07 ENCOUNTER — Encounter (HOSPITAL_COMMUNITY): Payer: Self-pay | Admitting: Family

## 2023-10-07 DIAGNOSIS — N6489 Other specified disorders of breast: Secondary | ICD-10-CM

## 2023-10-24 ENCOUNTER — Ambulatory Visit (HOSPITAL_COMMUNITY)
Admission: RE | Admit: 2023-10-24 | Discharge: 2023-10-24 | Disposition: A | Discharge status: Home / Self Care | Payer: 59 | Source: Ambulatory Visit | Attending: Family | Admitting: Family

## 2023-10-24 ENCOUNTER — Encounter (HOSPITAL_COMMUNITY): Payer: Self-pay

## 2023-10-24 DIAGNOSIS — N6489 Other specified disorders of breast: Secondary | ICD-10-CM | POA: Diagnosis present

## 2023-10-26 ENCOUNTER — Other Ambulatory Visit: Payer: Self-pay | Admitting: Neurology

## 2023-12-11 ENCOUNTER — Telehealth: Payer: Self-pay | Admitting: Adult Health

## 2023-12-12 NOTE — Telephone Encounter (Signed)
 Pt is needing Alternative Medication due to Previous Script being on [employer]

## 2023-12-13 NOTE — Telephone Encounter (Signed)
 Patient has been on Maxalt  in the past.  Are they saying that this is backordered?  And is it backordered just for that pharmacy or can she go to a different pharmacy and get it?

## 2023-12-16 NOTE — Telephone Encounter (Signed)
 Spoke with pharmacy. They received the Maxalt  over the weekend. They will fill and have ready for patient at 31. Spoke with pt. She states her last refill was in February. She thanked me for the call.

## 2023-12-25 ENCOUNTER — Telehealth (INDEPENDENT_AMBULATORY_CARE_PROVIDER_SITE_OTHER): Payer: 59 | Admitting: Adult Health

## 2023-12-25 DIAGNOSIS — G43009 Migraine without aura, not intractable, without status migrainosus: Secondary | ICD-10-CM | POA: Diagnosis not present

## 2023-12-25 NOTE — Progress Notes (Signed)
 PATIENT: Carmen Wheeler DOB: Jan 30, 1986  REASON FOR VISIT: follow up HISTORY FROM: patient  Virtual Visit via Video Note  I connected with Carmen Wheeler on 12/25/23 at 10:00 AM EDT by a video enabled telemedicine application located remotely at [facility] and verified that I am speaking with the correct person using two identifiers who was located at their work in [facility]   I discussed the limitations of evaluation and management by telemedicine and the availability of in person appointments. The patient expressed understanding and agreed to proceed.   PATIENT: Carmen Wheeler DOB: Jan 13, 1986  REASON FOR VISIT: follow up HISTORY FROM: patient  HISTORY OF PRESENT ILLNESS: Today 12/25/23   Carmen Wheeler is a 38 y.o. female with a history of migraine headaches. Returns today for follow-up.  She remains on Emgality  and rizatriptan .  She reports that this is working well for her.  States that she cannot remember the last time she had a headache.  She typically does not get an aura with her headaches.  Denies any numbness tingling or weakness associated with her headaches.  Overall she feels that she is doing well.  Denies any new medical history.  Returns today for an evaluation.   HISTORY 09/16/23   Carmen Wheeler is a 38 y.o. female who has been followed in this office for migraine headaches. Returns today for follow-up.  She states that she is currently taking Emgality  and rizatriptan .  She is only having approximately 1 headache a month.  Reports that when she takes Maxalt  she typically goes to sleep and the headache resolves.  She is happy with her current treatment plan.  She returns today for an evaluation.     HISTORY (copied from Dr. Margrette Shield note) 38 year old female without significant medical history who follows in clinic for migraines.   At her last visit she was started on Topamax  for migraine prevention. Maxalt  was continued  for rescue. Interval History: She developed mood swings, fatigue, and blurry vision on Topamax  so this was discontinued. She is currently averaging 3-4 migraines per month. Maxalt  typically works well for rescue. It makes her sleepy, which she does not mind because it helps her relax.   She woke up with a migraine this morning. Took Maxalt  which helped took the edge off but she still has a lingering headache.   Migraine days per month: 4 Headache free days per month: 26   Current Headache Regimen: Preventative: none Abortive: Maxalt  10 mg PRN     Prior Therapies                                  Rescue: Excedrin Tylenol  Maxalt  10 mg PRN   Prevention: Amitriptyline  25 mg at bedtime - dry mouth Topamax  - fatigue, blurry vision, mood swings  REVIEW OF SYSTEMS: Out of a complete 14 system review of symptoms, the patient complains only of the following symptoms, and all other reviewed systems are negative.  ALLERGIES: No Known Allergies  HOME MEDICATIONS: Outpatient Medications Prior to Visit  Medication Sig Dispense Refill   acetaminophen  (TYLENOL ) 325 MG tablet Take 650 mg by mouth every 6 (six) hours as needed for headache.     FEROSUL 325 (65 Fe) MG tablet Take 1 tablet (325 mg total) by mouth 2 (two) times daily with a meal. 60 tablet 2   Galcanezumab -gnlm (EMGALITY ) 120 MG/ML SOAJ Inject 1 Pen into the skin every  30 (thirty) days. 1.12 mL 12   ibuprofen  (ADVIL ,MOTRIN ) 600 MG tablet Take 1 tablet (600 mg total) by mouth every 6 (six) hours. 30 tablet 0   methocarbamol  (ROBAXIN ) 500 MG tablet Take 1 tablet (500 mg total) by mouth 2 (two) times daily. 20 tablet 0   oxyCODONE  (OXY IR/ROXICODONE ) 5 MG immediate release tablet Take 1 tablet (5 mg total) by mouth every 4 (four) hours as needed (pain scale 4-7). 30 tablet 0   rizatriptan  (MAXALT ) 10 MG tablet TAKE 1 TABLET BY MOUTH AS NEEDED FOR MIGRAINE. MAY REPEAT IN 2 HOURS IF NEEDED. MAX DOSE 2 PILLS IN 24 HOURS 10 tablet 2    topiramate  (TOPAMAX ) 50 MG tablet Take 0.5 tablets (25 mg total) by mouth at bedtime. 30 tablet 5   No facility-administered medications prior to visit.    PAST MEDICAL HISTORY: Past Medical History:  Diagnosis Date   Anemia    Migraine    Renal disorder     PAST SURGICAL HISTORY: Past Surgical History:  Procedure Laterality Date   CESAREAN SECTION     CESAREAN SECTION N/A 06/08/2018   Procedure: REPEAT CESAREAN SECTION;  Surgeon: Renea Carrion, MD;  Location: [facility];  Service: Obstetrics;  Laterality: N/A;  RNFA Requested  Tracey   WISDOM TOOTH EXTRACTION      FAMILY HISTORY: Family History  Problem Relation Age of Onset   Healthy Mother    Healthy Father    Migraines Brother     SOCIAL HISTORY: Social History   Socioeconomic History   Marital status: Married    Spouse name: Not on file   Number of children: Not on file   Years of education: Not on file   Highest education level: Not on file  Occupational History   Not on file  Tobacco Use   Smoking status: Never   Smokeless tobacco: Never  Vaping Use   Vaping status: Never Used  Substance and Sexual Activity   Alcohol use: No   Drug use: No   Sexual activity: Yes  Other Topics Concern   Not on file  Social History Narrative   Not on file   Social Drivers of Health   Financial Resource Strain: Low Risk  (05/26/2018)   Overall Financial Resource Strain (CARDIA)    Difficulty of Paying Living Expenses: Not hard at all  Food Insecurity: No Food Insecurity (05/26/2018)   Hunger Vital Sign    Worried About Running Out of Food in the Last Year: Never true    Ran Out of Food in the Last Year: Never true  Transportation Needs: Carmen (05/26/2018)   PRAPARE - Administrator, Civil Service (Medical): No    Lack of Transportation (Non-Medical): Not on file  Physical Activity: Not on file  Stress: No Stress Concern Present (05/26/2018)   [employer] of Occupational Health -  Occupational Stress Questionnaire    Feeling of Stress : Not at all  Social Connections: Not on file  Intimate Partner Violence: Not At Risk (05/26/2018)   Humiliation, Afraid, Rape, and Kick questionnaire    Fear of Current or Ex-Partner: No    Emotionally Abused: No    Physically Abused: No    Sexually Abused: No      PHYSICAL EXAM Generalized: Well developed, in no acute distress   Neurological examination  Mentation: Alert oriented to time, place, history taking. Follows all commands speech and language fluent Cranial nerve II-XII:Extraocular movements were full. Facial symmetry noted.  DIAGNOSTIC DATA (LABS, IMAGING, TESTING) - I reviewed patient records, labs, notes, testing and imaging myself where available.  Lab Results  Component Value Date   WBC 17.3 (H) 06/09/2018   HGB 8.2 (L) 06/09/2018   HCT 23.0 (L) 06/09/2018   MCV 88.8 06/09/2018   PLT 160 06/09/2018      Component Value Date/Time   NA 139 08/25/2017 1255   K 3.4 (L) 08/25/2017 1255   CL 106 08/25/2017 1255   CO2 25 08/25/2017 1255   GLUCOSE 94 08/25/2017 1255   BUN 7 08/25/2017 1255   CREATININE 0.50 08/25/2017 1255   CALCIUM 8.8 (L) 08/25/2017 1255   PROT 5.7 (L) 08/25/2017 1255   ALBUMIN 3.5 08/25/2017 1255   AST 20 08/25/2017 1255   ALT 17 08/25/2017 1255   ALKPHOS 48 08/25/2017 1255   BILITOT 0.4 08/25/2017 1255   GFRNONAA >60 08/25/2017 1255   GFRAA >60 08/25/2017 1255     ASSESSMENT AND PLAN 38 y.o. year old female  has a past medical history of Anemia, Migraine, and Renal disorder. here with:  1.  Migraine headaches  -Continue Emgality  monthly injection - Continue rizatriptan  for abortive therapy - Advised if symptoms worsen or she develops new symptoms she should let us  know - Follow-up in 1 year or sooner if needed     Clem Currier, MSN, NP-C 12/25/2023, 9:58 AM [facility] [address], [address] [place], [place] [address] [phone]

## 2023-12-25 NOTE — Patient Instructions (Signed)
 Continue Emgality  and rizatriptan 

## 2024-01-06 ENCOUNTER — Emergency Department (HOSPITAL_COMMUNITY)
Admission: EM | Admit: 2024-01-06 | Discharge: 2024-01-06 | Disposition: A | Discharge status: Home / Self Care | Attending: Emergency Medicine | Admitting: Emergency Medicine

## 2024-01-06 ENCOUNTER — Emergency Department (HOSPITAL_COMMUNITY)

## 2024-01-06 ENCOUNTER — Encounter (HOSPITAL_COMMUNITY): Payer: Self-pay

## 2024-01-06 ENCOUNTER — Other Ambulatory Visit: Payer: Self-pay

## 2024-01-06 DIAGNOSIS — R1032 Left lower quadrant pain: Secondary | ICD-10-CM

## 2024-01-06 DIAGNOSIS — N83202 Unspecified ovarian cyst, left side: Secondary | ICD-10-CM | POA: Diagnosis not present

## 2024-01-06 LAB — CBC WITH DIFFERENTIAL/PLATELET
Abs Immature Granulocytes: 0.04 10*3/uL (ref 0.00–0.07)
Basophils Absolute: 0.1 10*3/uL (ref 0.0–0.1)
Basophils Relative: 1 %
Eosinophils Absolute: 0.3 10*3/uL (ref 0.0–0.5)
Eosinophils Relative: 4 %
HCT: 39.5 % (ref 36.0–46.0)
Hemoglobin: 12.9 g/dL (ref 12.0–15.0)
Immature Granulocytes: 1 %
Lymphocytes Relative: 31 %
Lymphs Abs: 2.2 10*3/uL (ref 0.7–4.0)
MCH: 29.5 pg (ref 26.0–34.0)
MCHC: 32.7 g/dL (ref 30.0–36.0)
MCV: 90.4 fL (ref 80.0–100.0)
Monocytes Absolute: 0.4 10*3/uL (ref 0.1–1.0)
Monocytes Relative: 5 %
Neutro Abs: 4.1 10*3/uL (ref 1.7–7.7)
Neutrophils Relative %: 58 %
Platelets: 263 10*3/uL (ref 150–400)
RBC: 4.37 MIL/uL (ref 3.87–5.11)
RDW: 13 % (ref 11.5–15.5)
WBC: 7 10*3/uL (ref 4.0–10.5)
nRBC: 0 % (ref 0.0–0.2)

## 2024-01-06 LAB — URINALYSIS, ROUTINE W REFLEX MICROSCOPIC
Bilirubin Urine: NEGATIVE
Glucose, UA: NEGATIVE mg/dL
Ketones, ur: NEGATIVE mg/dL
Leukocytes,Ua: NEGATIVE
Nitrite: NEGATIVE
Protein, ur: NEGATIVE mg/dL
Specific Gravity, Urine: 1.01 (ref 1.005–1.030)
pH: 6 (ref 5.0–8.0)

## 2024-01-06 LAB — COMPREHENSIVE METABOLIC PANEL WITH GFR
ALT: 13 U/L (ref 0–44)
AST: 16 U/L (ref 15–41)
Albumin: 3.4 g/dL — ABNORMAL LOW (ref 3.5–5.0)
Alkaline Phosphatase: 55 U/L (ref 38–126)
Anion gap: 7 (ref 5–15)
BUN: 9 mg/dL (ref 6–20)
CO2: 26 mmol/L (ref 22–32)
Calcium: 9 mg/dL (ref 8.9–10.3)
Chloride: 106 mmol/L (ref 98–111)
Creatinine, Ser: 0.63 mg/dL (ref 0.44–1.00)
GFR, Estimated: >60 mL/min (ref >60)
Glucose, Bld: 83 mg/dL (ref 70–99)
Potassium: 4.4 mmol/L (ref 3.5–5.1)
Sodium: 139 mmol/L (ref 135–145)
Total Bilirubin: 0.3 mg/dL (ref 0.0–1.2)
Total Protein: 6.4 g/dL — ABNORMAL LOW (ref 6.5–8.1)

## 2024-01-06 LAB — POC URINE PREG, ED: Preg Test, Ur: NEGATIVE

## 2024-01-06 MED ORDER — ONDANSETRON HCL 4 MG/2ML IJ SOLN: 4.0000 mg | Freq: Once | INTRAMUSCULAR | Status: DC

## 2024-01-06 MED ORDER — HYDROMORPHONE HCL 1 MG/ML IJ SOLN: 1.0000 mg | Freq: Once | INTRAMUSCULAR | Status: DC

## 2024-01-06 MED ORDER — IOHEXOL 350 MG/ML SOLN
75.0000 mL | Freq: Once | INTRAVENOUS | Status: AC | PRN
  Administered 2024-01-06: 75 mL via INTRAVENOUS

## 2024-01-06 NOTE — ED Triage Notes (Signed)
 Pt comes in for L pelvic pain. States IUD placed a year ago and was seen at health dept last week where they attempted to remove IUD. Unable to remove and was sent for US  referral. Pt comes in increased pain this past weekend. Denies bloody discharge

## 2024-01-06 NOTE — Discharge Instructions (Signed)
 Follow-up with the health department.  It is a simple cyst but needs to be followed.  Take Motrin  800 mg every 8 hours as needed for the pain.  Return for any new or worse symptoms.  Following up to make sure the cyst resolve is important.  Return for any new or worse symptoms.  If you develop severe pain that can be a sign of torsion of that ovary.  The cyst is large but it is on the left ovary so it is probably causing your discomfort.

## 2024-01-06 NOTE — ED Provider Notes (Addendum)
 [facility] AT [facility] Provider Note   CSN: [id number] Arrival date & time: 01/06/24  1033     History  Chief Complaint  Patient presents with   Pelvic Pain    Carmen Wheeler is a 38 y.o. female.  Patient with a complaint of left lower quadrant abdominal pain constant for the past week.  Was seen at the health department had workup for infection.  They noticed that they could not see her IUD string.  They did do cultures and they were not worried about a pelvic infection.  Patient has had some rare nausea and vomiting with it.  No fevers.  Apparently they sent her for an ultrasound referral.  But does not sound like she got a referral to GYN.  Temp here 98.2 pulse 78 respirations 19 blood pressure 102/65 oxygen saturation is 100% on room air.  Past medical history significant for anemia renal disorder and migraine.  Past surgical history significant for cesarean section.  Patient is never used tobacco products.       Home Medications Prior to Admission medications   Medication Sig Start Date End Date Taking? Authorizing Provider  acetaminophen  (TYLENOL ) 325 MG tablet Take 650 mg by mouth every 6 (six) hours as needed for headache. Patient not taking: Reported on 12/25/2023    [provider]  FEROSUL 325 (65 Fe) MG tablet Take 1 tablet (325 mg total) by mouth 2 (two) times daily with a meal. Patient not taking: Reported on 12/25/2023 06/11/18   Prothero, Jonelle Neri, CNM  Galcanezumab -gnlm (EMGALITY ) 120 MG/ML SOAJ Inject 1 Pen into the skin every 30 (thirty) days. 09/16/23   Millikan, Megan, NP  ibuprofen  (ADVIL ,MOTRIN ) 600 MG tablet Take 1 tablet (600 mg total) by mouth every 6 (six) hours. Patient not taking: Reported on 12/25/2023 06/11/18   Prothero, Jonelle Neri, CNM  methocarbamol  (ROBAXIN ) 500 MG tablet Take 1 tablet (500 mg total) by mouth 2 (two) times daily. Patient not taking: Reported on 12/25/2023 01/12/22   Prosperi, Christian  H, PA-C  oxyCODONE  (OXY IR/ROXICODONE ) 5 MG immediate release tablet Take 1 tablet (5 mg total) by mouth every 4 (four) hours as needed (pain scale 4-7). Patient not taking: Reported on 12/25/2023 06/11/18   Prothero, Jonelle Neri, CNM  rizatriptan  (MAXALT ) 10 MG tablet TAKE 1 TABLET BY MOUTH AS NEEDED FOR MIGRAINE. MAY REPEAT IN 2 HOURS IF NEEDED. MAX DOSE 2 PILLS IN 24 HOURS 10/28/23   Millikan, Megan, NP      Allergies    Patient has no known allergies.    Review of Systems   Review of Systems  Constitutional:  Negative for chills and fever.  HENT:  Negative for ear pain and sore throat.   Eyes:  Negative for pain and visual disturbance.  Respiratory:  Negative for cough and shortness of breath.   Cardiovascular:  Negative for chest pain and palpitations.  Gastrointestinal:  Positive for abdominal pain, nausea and vomiting.  Genitourinary:  Positive for pelvic pain. Negative for dysuria and hematuria.  Musculoskeletal:  Negative for arthralgias and back pain.  Skin:  Negative for color change and rash.  Neurological:  Negative for seizures and syncope.  All other systems reviewed and are negative.   Physical Exam Updated Vital Signs BP 98/77   Pulse 64   Temp 98 F (36.7 C)   Resp 18   Ht 1.499 m (4\' 11" )   Wt 57.6 kg   SpO2 100%   BMI 25.65 kg/m  Physical Exam Vitals and nursing note reviewed.  Constitutional:      General: She is not in acute distress.    Appearance: Normal appearance. She is well-developed.  HENT:     Head: Normocephalic and atraumatic.  Eyes:     Extraocular Movements: Extraocular movements intact.     Conjunctiva/sclera: Conjunctivae normal.     Pupils: Pupils are equal, round, and reactive to light.  Cardiovascular:     Rate and Rhythm: Normal rate and regular rhythm.     Heart sounds: No murmur heard. Pulmonary:     Effort: Pulmonary effort is normal. No respiratory distress.     Breath sounds: Normal breath sounds.  Abdominal:      Palpations: Abdomen is soft.     Tenderness: There is abdominal tenderness.  Musculoskeletal:        General: No swelling.     Cervical back: Normal range of motion and neck supple.  Skin:    General: Skin is warm and dry.     Capillary Refill: Capillary refill takes less than 2 seconds.  Neurological:     General: No focal deficit present.     Mental Status: She is alert and oriented to person, place, and time.  Psychiatric:        Mood and Affect: Mood normal.     ED Results / Procedures / Treatments   Labs (all labs ordered are listed, but only abnormal results are displayed) Labs Reviewed  URINALYSIS, ROUTINE W REFLEX MICROSCOPIC - Abnormal; Notable for the following components:      Result Value   Color, Urine STRAW (*)    Hgb urine dipstick MODERATE (*)    Bacteria, UA RARE (*)    All other components within normal limits  COMPREHENSIVE METABOLIC PANEL WITH GFR - Abnormal; Notable for the following components:   Total Protein 6.4 (*)    Albumin 3.4 (*)    All other components within normal limits  CBC WITH DIFFERENTIAL/PLATELET  POC URINE PREG, ED    EKG None  Radiology CT ABDOMEN PELVIS W CONTRAST Result Date: 01/06/2024 EXAM: CT ABDOMEN AND PELVIS WITH CONTRAST 01/06/2024 01:34:22 PM TECHNIQUE: CT of the abdomen and pelvis was performed with the administration of intravenous contrast. Multiplanar reformatted images are provided for review. Automated exposure control, iterative reconstruction, and/or weight based adjustment of the mA/kV was utilized to reduce the radiation dose to as low as reasonably achievable. COMPARISON: CT abdomen and pelvis with contrast 08/25/2017. CLINICAL HISTORY: LLQ abdominal pain. FINDINGS: LOWER CHEST: No acute abnormality. LIVER: The liver is unremarkable. GALLBLADDER AND BILE DUCTS: Gallbladder is unremarkable. No biliary ductal dilatation. SPLEEN: No acute abnormality. PANCREAS: No acute abnormality. ADRENAL GLANDS: No acute abnormality.  KIDNEYS, URETERS AND BLADDER: No stones in the kidneys or ureters. No hydronephrosis. No perinephric or periureteral stranding. Urinary bladder is unremarkable. GI AND BOWEL: Stomach demonstrates no acute abnormality. There is no bowel obstruction. No appendicitis. PERITONEUM AND RETROPERITONEUM: No ascites. No free air. VASCULATURE: Aorta is normal in caliber. LYMPH NODES: No lymphadenopathy. REPRODUCTIVE ORGANS: IUD is in satisfactory position. A left adnexal process cyst measures 3.9 x 2.2 x 2.8 cm. No inflammatory changes are associated. BONES AND SOFT TISSUES: No acute osseous abnormality. No focal soft tissue abnormality. IMPRESSION: 1. Left adnexal process cyst measuring 3.9 x 2.2 x 2.8 cm without associated inflammatory changes. Electronically signed by: Audree Leas MD 01/06/2024 01:52 PM EDT RP Workstation: [id number]    Procedures Procedures    Medications Ordered in ED  Medications  HYDROmorphone (DILAUDID) injection 1 mg (has no administration in time range)  ondansetron  (ZOFRAN ) injection 4 mg (has no administration in time range)  iohexol (OMNIPAQUE) 350 MG/ML injection 75 mL (75 mLs Intravenous Contrast Given 01/06/24 1335)    ED Course/ Medical Decision Making/ A&P                                 Medical Decision Making Amount and/or Complexity of Data Reviewed Labs: ordered. Radiology: ordered.  Risk Prescription drug management.   Patient's abdominal pain and tenderness left lower quadrant.  Will start with CT scan abdomen pelvis in case this is diverticulitis.  Health department already ruled out any concerns for pelvic infection based on their exam.  If the CT scan does not show any significant findings will do ultrasound.  Patient's pregnancy test and urinalysis is normal  Patient's labs CBC normal.  Complete metabolic panel normal including liver function test.  Urinalysis negative for urinary tract infection.  Pregnancy test negative.  CT scan of the abdomen  left adnexal process cyst measuring 3.9 x 2.2 x 2.8 cm without associated inflammatory changes IUD is in satisfactory position.  So appears to be a left adnexal cyst which can be treated symptomatically.  Patient will be good for discharge.  Did discuss with Dr. Vanda Gemma about the Word process being used for the cyst that should not be there.  This is not a complex cyst that just a typical ovarian cyst.  Will treat as such and have patient follow-up with the health department.   Final Clinical Impression(s) / ED Diagnoses Final diagnoses:  Left lower quadrant abdominal pain  Left ovarian cyst    Rx / DC Orders ED Discharge Orders     None         Nicklas Barns, MD 01/06/24 1210    Nicklas Barns, MD 01/06/24 1436    Nicklas Barns, MD 01/06/24 1524    Nicklas Barns, MD 01/06/24 1525

## 2024-01-13 ENCOUNTER — Telehealth: Payer: Self-pay | Admitting: Adult Health

## 2024-01-13 MED ORDER — EMGALITY 120 MG/ML ~~LOC~~ SOAJ: 1.0000 | SUBCUTANEOUS | 12 refills | Status: AC

## 2024-01-13 NOTE — Telephone Encounter (Signed)
 Pt reports that last month she picked up her Galcanezumab -gnlm (EMGALITY ) 120 MG/ML SOAJ  from [facility] [phone], she is requesting another refill be sent to this pharmacy as well

## 2024-01-13 NOTE — Telephone Encounter (Signed)
 Refills sent to [facility] [phone]

## 2024-01-14 ENCOUNTER — Encounter: Payer: Self-pay | Admitting: Family

## 2024-01-15 ENCOUNTER — Ambulatory Visit
Admission: RE | Admit: 2024-01-15 | Discharge: 2024-01-15 | Disposition: A | Discharge status: Home / Self Care | Source: Ambulatory Visit | Attending: Family | Admitting: Family

## 2024-01-15 ENCOUNTER — Encounter: Payer: Self-pay | Admitting: Family

## 2024-01-15 ENCOUNTER — Ambulatory Visit: Admitting: Family

## 2024-01-15 VITALS — BP 98/64 | HR 63 | Temp 97.2°F | Ht 59.0 in | Wt 129.0 lb

## 2024-01-15 DIAGNOSIS — L659 Nonscarring hair loss, unspecified: Secondary | ICD-10-CM | POA: Diagnosis not present

## 2024-01-15 DIAGNOSIS — Z13220 Encounter for screening for lipoid disorders: Secondary | ICD-10-CM | POA: Diagnosis not present

## 2024-01-15 DIAGNOSIS — M25511 Pain in right shoulder: Secondary | ICD-10-CM

## 2024-01-15 DIAGNOSIS — Z1159 Encounter for screening for other viral diseases: Secondary | ICD-10-CM

## 2024-01-15 DIAGNOSIS — I950 Idiopathic hypotension: Secondary | ICD-10-CM

## 2024-01-15 DIAGNOSIS — G43001 Migraine without aura, not intractable, with status migrainosus: Secondary | ICD-10-CM

## 2024-01-15 DIAGNOSIS — N83202 Unspecified ovarian cyst, left side: Secondary | ICD-10-CM

## 2024-01-15 DIAGNOSIS — Z23 Encounter for immunization: Secondary | ICD-10-CM

## 2024-01-15 DIAGNOSIS — Z7689 Persons encountering health services in other specified circumstances: Secondary | ICD-10-CM

## 2024-01-15 NOTE — Patient Instructions (Signed)
 -  Please get right shoulder X-ray at [facility] imaging at [facility] then will call you with results.

## 2024-01-17 ENCOUNTER — Ambulatory Visit: Payer: Self-pay | Admitting: Family

## 2024-01-17 DIAGNOSIS — T50905A Adverse effect of unspecified drugs, medicaments and biological substances, initial encounter: Secondary | ICD-10-CM

## 2024-01-17 DIAGNOSIS — E7800 Pure hypercholesterolemia, unspecified: Secondary | ICD-10-CM

## 2024-01-17 DIAGNOSIS — Z13220 Encounter for screening for lipoid disorders: Secondary | ICD-10-CM

## 2024-01-17 NOTE — Telephone Encounter (Signed)
 High risk or very high risk warning populated when attempting to add lipitor. RX request sent to PCP for review and approval if warranted.

## 2024-01-19 NOTE — Progress Notes (Signed)
 Provider: Christean Courts FNP-C   Kilee Hedding, Elijio Guadeloupe, NP  Patient Care Team: Nissa Stannard, Elijio Guadeloupe, NP as PCP - General (Family Medicine)  Extended Emergency Contact Information Primary Emergency Contact: NEESCE,MICKEY  [place] Mobile Phone: [phone] Relation: Spouse Secondary Emergency Contact: Cline Dan Address: [address]          [place], [place] [address] [place] Home Phone: [phone] Relation: Mother  Code Status:  Full Code  Goals of care: Advanced Directive information    08/09/2022   12:30 PM  Advanced Directives  Does Patient Have a Medical Advance Directive? No  Would patient like information on creating a medical advance directive? No - Patient declined     Chief Complaint  Patient presents with   New Patient (Initial Visit)    New patient  establish care , discuss hair loss she said that this has been going for sometime, she isn't taking any for this  as not changed any shampoos.      Discussed the use of AI scribe software for clinical note transcription with the patient, who gave verbal consent to proceed.  History of Present Illness   Carmen Wheeler is a 38 year old female who presents to establish care.  She has a history of migraines, which have significantly improved from fifteen episodes a month to one or two after treatment with Emgality , Rezaptive, and Maxalt . She previously consulted a neurologist to find an effective treatment regimen.  She recently visited the ER on May 26th due to abdominal pain, where a CT scan revealed a cyst measuring 3.9 x 2.2 x 2.0 cm. She has an upcoming appointment with a gynecologist on July 17th. The abdominal pain has limited her ability to exercise, particularly weightlifting, which she used to do regularly at the [facility].  She had a mammogram in March due to breast pain and enlargement and is scheduled for a follow-up mammogram in six months.  She experiences low  blood pressure, with symptoms of dizziness and seeing 'little stars'. She drinks water  regularly and eats two to three meals a day.  She reports significant hair loss, particularly when brushing or showering, and has not changed hair products recently.  She has a history of an IUD insertion on Jan 04, 2023, and reports some bleeding but the IUD is confirmed to be in place.  She has occasional shoulder pain and popping, particularly in the right shoulder, which worsens at night and during certain movements.     Past Medical History:  Diagnosis Date   Anemia    Migraine    Renal disorder    Past Surgical History:  Procedure Laterality Date   CESAREAN SECTION  2005   CESAREAN SECTION N/A 06/08/2018   Procedure: REPEAT CESAREAN SECTION;  Surgeon: Renea Carrion, MD;  Location: [facility] BIRTHING SUITES;  Service: Obstetrics;  Laterality: N/A;  RNFA Requested  Tracey   CESAREAN SECTION  2002   CESAREAN SECTION  2008   WISDOM TOOTH EXTRACTION      No Known Allergies  Allergies as of 01/15/2024   No Known Allergies      Medication List        Accurate as of January 15, 2024 11:59 PM. If you have any questions, ask your nurse or doctor.          STOP taking these medications    acetaminophen  325 MG tablet Commonly known as: TYLENOL  Stopped by: Murrel Freet C Tieara Flitton   FeroSul 325 (65 Fe) MG  tablet Generic drug: ferrous sulfate Stopped by: Elijio Guadeloupe Noah Lembke   methocarbamol  500 MG tablet Commonly known as: ROBAXIN  Stopped by: Emmalyn Hinson C Nika Yazzie   oxyCODONE  5 MG immediate release tablet Commonly known as: Oxy IR/ROXICODONE  Stopped by: Marielle Mantione C Zhoe Catania       TAKE these medications    Emgality  120 MG/ML Soaj Generic drug: Galcanezumab -gnlm Inject 1 Pen into the skin every 30 (thirty) days.   ibuprofen  600 MG tablet Commonly known as: ADVIL  Take 1 tablet (600 mg total) by mouth every 6 (six) hours.   levonorgestrel 20 MCG/DAY Iud Commonly known as: MIRENA 1 each by Intrauterine  route once.   rizatriptan  10 MG tablet Commonly known as: MAXALT  TAKE 1 TABLET BY MOUTH AS NEEDED FOR MIGRAINE. MAY REPEAT IN 2 HOURS IF NEEDED. MAX DOSE 2 PILLS IN 24 HOURS        Review of Systems  Immunization History  Administered Date(s) Administered   Influenza-Unspecified 05/20/2023   PPD Test 06/07/2023   Pfizer(Comirnaty)Fall Seasonal Vaccine 12 years and older 01/04/2020, 01/25/2020, 09/15/2020   Tdap 01/15/2024   Pertinent  Health Maintenance Due  Topic Date Due   INFLUENZA VACCINE  03/13/2024      09/15/2019    5:24 PM 03/21/2020   11:33 AM 01/12/2022    5:50 PM 08/09/2022   12:30 PM 01/15/2024   11:21 AM  Fall Risk  Falls in the past year?     0  Was there an injury with Fall?     0  Fall Risk Category Calculator     0  (RETIRED) Patient Fall Risk Level Low fall risk Low fall risk Low fall risk Low fall risk   Patient at Risk for Falls Due to     No Fall Risks  Fall risk Follow up     Falls evaluation completed   Functional Status Survey:    Vitals:   01/15/24 0950  BP: 98/64  Pulse: 63  Temp: (!) 97.2 F (36.2 C)  TempSrc: Temporal  SpO2: 98%  Weight: 129 lb (58.5 kg)  Height: 4\' 11"  (1.499 m)   Body mass index is 26.05 kg/m. Physical Exam  VITALS: BP- 98/64 GENERAL: Alert, cooperative, well developed, no acute distress. HEENT: Normocephalic, normal oropharynx, moist mucous membranes, tympanic membranes normal bilaterally, nose without swelling, no sinus tenderness. NECK: Neck normal. CHEST: Clear to auscultation bilaterally, no wheezes, rhonchi, or crackles. CARDIOVASCULAR: Normal heart rate and rhythm, S1 and S2 normal without murmurs. ABDOMEN: Soft, non-tender, non-distended, without organomegaly, normal bowel sounds, liver non-palpable. EXTREMITIES: No cyanosis or edema. Right shoulder Pops with movement  MUSCULOSKELETAL: Right hip normal range of motion, non-tender; left hip normal range of motion, non-tender; right shoulder pain on abduction  and adduction; left shoulder normal range of motion. NEUROLOGICAL: Cranial nerves grossly intact, moves all extremities without gross motor or sensory deficit.   Labs reviewed: Recent Labs    01/06/24 1205 01/15/24 1217  NA 139 138  K 4.4 4.9  CL 106 104  CO2 26 26  GLUCOSE 83 81  BUN 9 13  CREATININE 0.63 0.52  CALCIUM 9.0 9.6   Recent Labs    01/06/24 1205 01/15/24 1217  AST 16 12  ALT 13 11  ALKPHOS 55  --   BILITOT 0.3 0.4  PROT 6.4* 7.1  ALBUMIN 3.4*  --    Recent Labs    01/06/24 1205 01/15/24 1217  WBC 7.0 8.0  NEUTROABS 4.1 5,200  HGB 12.9 13.6  HCT 39.5  41.9  MCV 90.4 91.3  PLT 263 245   Lab Results  Component Value Date   TSH 4.66 (H) 01/15/2024   No results found for: "HGBA1C" Lab Results  Component Value Date   CHOL 234 (H) 01/15/2024   HDL 43 (L) 01/15/2024   LDLCALC 167 (H) 01/15/2024   TRIG 109 01/15/2024   CHOLHDL 5.4 (H) 01/15/2024    Significant Diagnostic Results in last 30 days:  DG Shoulder Right Result Date: 01/16/2024 CLINICAL DATA:  Chronic right shoulder pain. EXAM: RIGHT SHOULDER - 2+ VIEW COMPARISON:  None Available. FINDINGS: Normal bone mineralization. Normal alignment. Joint spaces are preserved. No acute fracture is seen. No dislocation.The visualized portion of the right lung is unremarkable. IMPRESSION: Normal right shoulder radiographs. Electronically Signed   By: Bertina Broccoli M.D.   On: 01/16/2024 17:29   CT ABDOMEN PELVIS W CONTRAST Addendum Date: 01/06/2024 ADDENDUM #1 EXAM: CT ABDOMEN AND PELVIS WITH CONTRAST 01/06/2024 01:34:22 PM TECHNIQUE: CT of the abdomen and pelvis was performed with the administration of intravenous contrast. Multiplanar reformatted images are provided for review. Automated exposure control, iterative reconstruction, and/or weight based adjustment of the mA/kV was utilized to reduce the radiation dose to as low as reasonably achievable. COMPARISON: CT abdomen and pelvis with contrast 08/25/2017.  CLINICAL HISTORY: LLQ abdominal pain. FINDINGS: LOWER CHEST: No acute abnormality. LIVER: The liver is unremarkable. GALLBLADDER AND BILE DUCTS: Gallbladder is unremarkable. No biliary ductal dilatation. SPLEEN: No acute abnormality. PANCREAS: No acute abnormality. ADRENAL GLANDS: No acute abnormality. KIDNEYS, URETERS AND BLADDER: No stones in the kidneys or ureters. No hydronephrosis. No perinephric or periureteral stranding. Urinary bladder is unremarkable. GI AND BOWEL: Stomach demonstrates no acute abnormality. There is no bowel obstruction. No appendicitis. PERITONEUM AND RETROPERITONEUM: No ascites. No free air. VASCULATURE: Aorta is normal in caliber. LYMPH NODES: No lymphadenopathy. REPRODUCTIVE ORGANS: IUD is in satisfactory position. A left adnexal process cyst measures 3.9 x 2.2 x 2.8 cm. No inflammatory changes are associated. BONES AND SOFT TISSUES: No acute osseous abnormality. No focal soft tissue abnormality. IMPRESSION: 1. Left adnexal process cyst measuring 3.9 x 2.2 x 2.8 cm without associated inflammatory changes. Voice recognition error: The word "process" between adnexal and cyst should not be present. It should read: "Left adnexal cyst measuring 3.9 x 2.2 x 2.8 cm without associated inflammatory changes." Electronically signed by: Audree Leas MD 01/06/2024 03:24 PM EDT RP Workstation: [id number]   Result Date: 01/06/2024  ORIGINAL REPORT EXAM: CT ABDOMEN AND PELVIS WITH CONTRAST 01/06/2024 01:34:22 PM TECHNIQUE: CT of the abdomen and pelvis was performed with the administration of intravenous contrast. Multiplanar reformatted images are provided for review. Automated exposure control, iterative reconstruction, and/or weight based adjustment of the mA/kV was utilized to reduce the radiation dose to as low as reasonably achievable. COMPARISON: CT abdomen and pelvis with contrast 08/25/2017. CLINICAL HISTORY: LLQ abdominal pain. FINDINGS: LOWER CHEST: No acute abnormality. LIVER: The  liver is unremarkable. GALLBLADDER AND BILE DUCTS: Gallbladder is unremarkable. No biliary ductal dilatation. SPLEEN: No acute abnormality. PANCREAS: No acute abnormality. ADRENAL GLANDS: No acute abnormality. KIDNEYS, URETERS AND BLADDER: No stones in the kidneys or ureters. No hydronephrosis. No perinephric or periureteral stranding. Urinary bladder is unremarkable. GI AND BOWEL: Stomach demonstrates no acute abnormality. There is no bowel obstruction. No appendicitis. PERITONEUM AND RETROPERITONEUM: No ascites. No free air. VASCULATURE: Aorta is normal in caliber. LYMPH NODES: No lymphadenopathy. REPRODUCTIVE ORGANS: IUD is in satisfactory position. A left adnexal process cyst measures 3.9 x 2.2 x  2.8 cm. No inflammatory changes are associated. BONES AND SOFT TISSUES: No acute osseous abnormality. No focal soft tissue abnormality. IMPRESSION: 1. Left adnexal process cyst measuring 3.9 x 2.2 x 2.8 cm without associated inflammatory changes. Electronically signed by: Audree Leas MD 01/06/2024 01:52 PM EDT RP Workstation: [id number]    Assessment/Plan  Ovarian Cyst Recently diagnosed left ovarian cyst measuring 3.9 x 2.2 x 2.0 cm. No associated inflammation on CT scan. Reports minimal abdominal pain. - Follow up with gynecologist on July 17 for further evaluation. - Advise to avoid heavy lifting and straining exercises to prevent symptom exacerbation.  Right Shoulder Pain Chronic right shoulder pain with popping sensation and limited range of motion. Pain exacerbated by certain movements and during sleep. Possible rotator cuff or other musculoskeletal issue. - Order x-ray of right shoulder. - Refer to orthopedic specialist based on x-ray findings.  Hypotension Recent episodes of hypotension with dizziness and visual disturbances. Reports adequate water  intake but persistent thirst and dry mouth. No significant orthostatic symptoms. - Encourage adequate hydration. - Consider use of  compression stockings to help maintain blood pressure. - Order lab tests to check electrolytes and kidney function.  Migraine Chronic migraines reduced from fifteen episodes per month to one or two with current treatment regimen. Effective management with Emgality , Rezaptive, and Maxalt  as prescribed by neurologist. - Continue current migraine management with Emgality , Rezaptive, and Maxalt .  Alopecia Reports significant hair loss, particularly during brushing and showering. No recent changes in hair products. Differential diagnosis includes thyroid dysfunction and anemia. - Order lab tests to check thyroid function and screen for anemia.  Left Breast Asymmetry Probably benign left breast asymmetry identified on mammogram. Recommendation for follow-up mammogram in six months to monitor changes. - Schedule follow-up mammogram in six months. - Advise to report any changes or swelling in the breast.  General Health Maintenance Due for tetanus vaccine. Last COVID-19 vaccine in 2022. Pap smear last done in 2019, due for repeat in 2024. - Administer tetanus vaccine or advise to obtain it at a pharmacy. - Obtain records of recent COVID-19 vaccination and update medical records. - Sign release for medical records to obtain Pap smear results from health department. - Order fasting lab work including cholesterol and hepatitis C screening.  Follow-up Requires follow-up for health maintenance and diagnostic evaluations. - Schedule follow-up appointment in one year for annual physical. - Follow up with lab results and x-ray findings via MyChart or phone call.   Family/ staff Communication: Reviewed plan of care with patient verbalized understanding   Labs/tests ordered:  - CBC with Differential/Platelet - CMP with eGFR([facility]) - TSH - T 3 and T 4  - Lipid panel - Hep C Antibody   Next Appointment : Return in about 1 year (around 01/14/2025) for annual Physical examination.   Spent 45  minutes of Face to face and non-face to face with patient  >50% time spent counseling; reviewing medical record; tests; labs; documentation and developing future plan of care.   Estil Heman, NP

## 2024-01-23 MED ORDER — ATORVASTATIN CALCIUM 10 MG PO TABS: 10.0000 mg | ORAL_TABLET | Freq: Every day | ORAL | 5 refills | Status: DC

## 2024-02-14 ENCOUNTER — Other Ambulatory Visit: Payer: Self-pay | Admitting: Adult Health

## 2024-02-17 ENCOUNTER — Other Ambulatory Visit

## 2024-02-17 DIAGNOSIS — E7800 Pure hypercholesterolemia, unspecified: Secondary | ICD-10-CM

## 2024-02-17 DIAGNOSIS — T50905A Adverse effect of unspecified drugs, medicaments and biological substances, initial encounter: Secondary | ICD-10-CM

## 2024-02-17 LAB — HEPATIC FUNCTION PANEL
AG Ratio: 1.9 (calc) (ref 1.0–2.5)
ALT: 15 U/L (ref 6–29)
AST: 12 U/L (ref 10–30)
Albumin: 4.5 g/dL (ref 3.6–5.1)
Alkaline phosphatase (APISO): 59 U/L (ref 31–125)
Bilirubin, Direct: 0.1 mg/dL (ref 0.0–0.2)
Globulin: 2.4 g/dL (ref 1.9–3.7)
Indirect Bilirubin: 0.2 mg/dL (ref 0.2–1.2)
Total Bilirubin: 0.3 mg/dL (ref 0.2–1.2)
Total Protein: 6.9 g/dL (ref 6.1–8.1)

## 2024-02-17 LAB — LIPID PANEL
Cholesterol: 159 mg/dL
HDL: 41 mg/dL — ABNORMAL LOW
LDL Cholesterol (Calc): 97 mg/dL
Non-HDL Cholesterol (Calc): 118 mg/dL
Total CHOL/HDL Ratio: 3.9 (calc)
Triglycerides: 112 mg/dL

## 2024-02-19 ENCOUNTER — Ambulatory Visit: Payer: Self-pay | Admitting: Nurse Practitioner

## 2024-02-27 ENCOUNTER — Other Ambulatory Visit: Payer: Self-pay

## 2024-02-27 ENCOUNTER — Encounter: Payer: Self-pay | Admitting: Obstetrics and Gynecology

## 2024-02-27 ENCOUNTER — Ambulatory Visit: Admitting: Obstetrics and Gynecology

## 2024-02-27 VITALS — BP 101/65 | HR 72 | Wt 131.6 lb

## 2024-02-27 DIAGNOSIS — M791 Myalgia, unspecified site: Secondary | ICD-10-CM | POA: Diagnosis not present

## 2024-02-27 MED ORDER — BACLOFEN 10 MG PO TABS: 10.0000 mg | ORAL_TABLET | Freq: Two times a day (BID) | ORAL | 0 refills | Status: DC | PRN

## 2024-02-27 NOTE — Progress Notes (Signed)
 NEW GYNECOLOGY PATIENT Patient name: Carmen Wheeler MRN [id number]  Date of birth: [birth year] Chief Complaint:   Follow-up     History:  Carmen Wheeler is a 38 y.o. [id number] being seen today for ED follow up.  No fever, chills, nausea. No constipation ro diarreha. Mireana IUD 2024. No periods since then. Twisting sensation that sh was feeling. Whil sitting will feel it. When out and about awill not feel it. Somewhat pulsating sensation. Didn't take the ibupreofen because it didn't work. Not takign any pain meds currently. No other symptsm. When walking will not have any pain. Sexually active without any pain. When sitting down. No prior episodes of pain similar to this       Gynecologic History No LMP recorded. (Menstrual status: IUD). Contraception: IUD Last Pap: not seen on file Last Mammogram: 10/2023 [facility] 3 Last Colonoscopy: n/a  Obstetric History OB History  Gravida Para Term Preterm AB Living  6 3 3  2 3   SAB IAB Ectopic Multiple Live Births   2   3    # Outcome Date GA Lbr Len/2nd Weight Sex Type Anes PTL Lv  6 Gravida           5 Term 02/02/07    CHRISTELLA CS-LTranv   LIV  4 IAB 2008          3 Term 08/01/04    F CS-LTranv   LIV  2 IAB 2004          1 Term 10/10/00    CHRISTELLA Duran   LIV    Past Medical History:  Diagnosis Date   Anemia    Migraine    Renal disorder     Past Surgical History:  Procedure Laterality Date   CESAREAN SECTION  2005   CESAREAN SECTION N/A 06/08/2018   Procedure: REPEAT CESAREAN SECTION;  Surgeon: Henry Slough, MD;  Location: [facility] BIRTHING SUITES;  Service: Obstetrics;  Laterality: N/A;  RNFA Requested  Tracey   CESAREAN SECTION  2002   CESAREAN SECTION  2008   WISDOM TOOTH EXTRACTION      Current Outpatient Medications on File Prior to Visit  Medication Sig Dispense Refill   atorvastatin  (LIPITOR) 10 MG tablet Take 1 tablet (10 mg total) by mouth daily. 30 tablet 5   Galcanezumab -gnlm (EMGALITY ) 120 MG/ML SOAJ Inject 1 Pen  into the skin every 30 (thirty) days. 1.12 mL 12   ibuprofen  (ADVIL ,MOTRIN ) 600 MG tablet Take 1 tablet (600 mg total) by mouth every 6 (six) hours. 30 tablet 0   levonorgestrel (MIRENA) 20 MCG/DAY IUD 1 each by Intrauterine route once.     rizatriptan  (MAXALT ) 10 MG tablet TAKE 1 TABLET BY MOUTH AS NEEDED FOR MIGRAINE. MAY REPEAT IN 2 HOURS IF NEEDED. MAX DOSE 2 PILLS IN 24 HOURS 10 tablet 2   No current facility-administered medications on file prior to visit.    No Known Allergies  Social History:  reports that she has never smoked. She has never used smokeless tobacco. She reports current alcohol use. She reports that she does not use drugs.  Family History  Problem Relation Age of Onset   Healthy Mother    Alcohol abuse Father    Healthy Father    Migraines Brother     The following portions of the patient's history were reviewed and updated as appropriate: allergies, current medications, past family history, past medical history, past social history, past surgical history and problem list.  Review of Systems Pertinent items noted  in HPI and remainder of comprehensive ROS otherwise negative.  Physical Exam:  BP 101/65   Pulse 72   Wt 131 lb 9.6 oz (59.7 kg)   BMI 26.58 kg/m  Physical Exam Vitals and nursing note reviewed.  Constitutional:      Appearance: Normal appearance.  Cardiovascular:     Rate and Rhythm: Normal rate.  Pulmonary:     Effort: Pulmonary effort is normal.     Breath sounds: Normal breath sounds.  Abdominal:     Comments: Tender LLQ + carnett  Neurological:     General: No focal deficit present.     Mental Status: She is alert and oriented to person, place, and time.  Psychiatric:        Mood and Affect: Mood normal.        Behavior: Behavior normal.        Thought Content: Thought content normal.        Judgment: Judgment normal.        Assessment and Plan:   1. Myalgia (Primary) Myalgia noted on exam, trial of baclofen . Reviewed  imaging findings and adnexal cyst likely resolved.  - baclofen  (LIORESAL ) 10 MG tablet; Take 1 tablet (10 mg total) by mouth 2 (two) times daily as needed for muscle spasms.  Dispense: 30 each; Refill: 0   Routine preventative health maintenance measures emphasized. Please refer to After Visit Summary for other counseling recommendations.   Follow-up: No follow-ups on file.      Carter Quarry, MD Obstetrician & Gynecologist, Faculty Practice [facility] for [employer], [facility]

## 2024-03-09 ENCOUNTER — Encounter (HOSPITAL_COMMUNITY): Payer: Self-pay | Admitting: Family

## 2024-03-09 ENCOUNTER — Other Ambulatory Visit (HOSPITAL_COMMUNITY): Payer: Self-pay | Admitting: Family

## 2024-03-09 ENCOUNTER — Other Ambulatory Visit: Payer: Self-pay | Admitting: Obstetrics and Gynecology

## 2024-03-09 DIAGNOSIS — N6489 Other specified disorders of breast: Secondary | ICD-10-CM

## 2024-03-09 DIAGNOSIS — M791 Myalgia, unspecified site: Secondary | ICD-10-CM

## 2024-03-24 ENCOUNTER — Telehealth: Payer: Self-pay | Admitting: Adult Health

## 2024-03-24 NOTE — Telephone Encounter (Signed)
 Called Pharmacy per [facility] rep Rizatriptan  is not on recall .Refill has been ready for 4 days . Called patient made them aware Pt states reccieved a email from [facility] stating medication on recall . Informed patient take email to [facility] and let them review . If any changes let office know Pt thanked me for calling

## 2024-03-24 NOTE — Telephone Encounter (Signed)
 Pt called to informed MD that Pt medication ( rizatriptan  (MAXALT ) 10 MG tablet)  is on recall the patient was calling office to follow up on medication and what to do

## 2024-04-27 ENCOUNTER — Telehealth: Payer: Self-pay

## 2024-04-27 ENCOUNTER — Other Ambulatory Visit (HOSPITAL_COMMUNITY): Payer: Self-pay

## 2024-04-28 NOTE — Telephone Encounter (Signed)
   Unsure of why PA can not be initiated-will outreach plan to clarify/

## 2024-04-29 ENCOUNTER — Other Ambulatory Visit (HOSPITAL_COMMUNITY): Payer: Self-pay

## 2024-04-29 NOTE — Telephone Encounter (Signed)
 Pharmacy Patient Advocate Encounter   Received notification from CoverMyMeds that prior authorization for Emgality  is required/requested.   Insurance verification completed.   The patient is insured through [place] .   Per test claim: The current 30 day co-pay is, $15.00.  No PA needed at this time. This test claim was processed through [facility]- copay amounts may vary at other pharmacies due to pharmacy/plan contracts, or as the patient moves through the different stages of their insurance plan.

## 2024-05-07 ENCOUNTER — Encounter (HOSPITAL_COMMUNITY): Payer: Self-pay | Admitting: Family

## 2024-05-12 ENCOUNTER — Encounter (HOSPITAL_COMMUNITY): Payer: Self-pay

## 2024-05-12 ENCOUNTER — Ambulatory Visit (HOSPITAL_COMMUNITY)
Admission: RE | Admit: 2024-05-12 | Discharge: 2024-05-12 | Disposition: A | Discharge status: Home / Self Care | Source: Ambulatory Visit | Attending: Family | Admitting: Family

## 2024-05-12 ENCOUNTER — Ambulatory Visit (HOSPITAL_COMMUNITY)
Admission: RE | Admit: 2024-05-12 | Discharge: 2024-05-12 | Disposition: A | Source: Ambulatory Visit | Attending: Family | Admitting: Family

## 2024-05-12 ENCOUNTER — Other Ambulatory Visit (HOSPITAL_COMMUNITY): Payer: Self-pay | Admitting: Family

## 2024-05-12 DIAGNOSIS — R928 Other abnormal and inconclusive findings on diagnostic imaging of breast: Secondary | ICD-10-CM

## 2024-05-12 DIAGNOSIS — N6489 Other specified disorders of breast: Secondary | ICD-10-CM

## 2024-05-14 ENCOUNTER — Other Ambulatory Visit (HOSPITAL_COMMUNITY): Payer: Self-pay

## 2024-05-14 ENCOUNTER — Telehealth: Payer: Self-pay | Admitting: Pharmacist

## 2024-05-14 NOTE — Telephone Encounter (Signed)
 Pharmacy Patient Advocate Encounter   Received notification from Patient Pharmacy that prior authorization for Emgality  120MG /ML auto-injectors (migraine) is required/requested.   Insurance verification completed.   The patient is insured through [employer].   Per test claim: PA required; PA submitted to above mentioned insurance via Latent Key/confirmation #/EOC [id number] Status is pending

## 2024-05-15 NOTE — Addendum Note (Signed)
 Addended by: LELON, Kynnedy Carreno L on: 05/15/2024 09:39 AM   Modules accepted: Orders

## 2024-05-17 ENCOUNTER — Other Ambulatory Visit: Payer: Self-pay | Admitting: Adult Health

## 2024-05-18 ENCOUNTER — Other Ambulatory Visit (HOSPITAL_COMMUNITY): Payer: Self-pay | Admitting: Family

## 2024-05-18 ENCOUNTER — Other Ambulatory Visit (HOSPITAL_COMMUNITY): Payer: Self-pay

## 2024-05-18 ENCOUNTER — Other Ambulatory Visit

## 2024-05-18 DIAGNOSIS — R928 Other abnormal and inconclusive findings on diagnostic imaging of breast: Secondary | ICD-10-CM

## 2024-05-18 DIAGNOSIS — E7800 Pure hypercholesterolemia, unspecified: Secondary | ICD-10-CM

## 2024-05-18 LAB — LIPID PANEL
Cholesterol: 159 mg/dL (ref <200)
HDL: 38 mg/dL — ABNORMAL LOW (ref >=50)
LDL Cholesterol (Calc): 104 mg/dL — ABNORMAL HIGH
Non-HDL Cholesterol (Calc): 121 mg/dL (ref <130)
Total CHOL/HDL Ratio: 4.2 (calc) (ref <5.0)
Triglycerides: 76 mg/dL (ref <150)

## 2024-05-18 NOTE — Telephone Encounter (Signed)
 Pharmacy Patient Advocate Encounter  Received notification from [facility] that Prior Authorization for Emgality  120MG /ML auto-injectors (migraine) has been APPROVED from 05/17/2024 to 05/17/2025   PA #/Case ID/Reference #: [id number]

## 2024-05-18 NOTE — Telephone Encounter (Signed)
 Pharmacy Patient Advocate Encounter  Received notification from [facility] that Prior Authorization for Emgality  120mg  has been APPROVED from 05/17/24 to 05/17/25 Already filled 05/17/24  PA #/Case ID/Reference #: [id number]

## 2024-05-19 ENCOUNTER — Other Ambulatory Visit (HOSPITAL_COMMUNITY): Payer: Self-pay | Admitting: Family

## 2024-05-19 ENCOUNTER — Ambulatory Visit (HOSPITAL_COMMUNITY)
Admission: RE | Admit: 2024-05-19 | Discharge: 2024-05-19 | Disposition: A | Discharge status: Home / Self Care | Source: Ambulatory Visit | Attending: Family | Admitting: Family

## 2024-05-19 ENCOUNTER — Ambulatory Visit (HOSPITAL_COMMUNITY)
Admission: RE | Admit: 2024-05-19 | Discharge: 2024-05-19 | Disposition: A | Source: Ambulatory Visit | Attending: Family | Admitting: Family

## 2024-05-19 ENCOUNTER — Encounter (HOSPITAL_COMMUNITY): Payer: Self-pay

## 2024-05-19 DIAGNOSIS — R928 Other abnormal and inconclusive findings on diagnostic imaging of breast: Secondary | ICD-10-CM

## 2024-05-19 DIAGNOSIS — N6001 Solitary cyst of right breast: Secondary | ICD-10-CM | POA: Insufficient documentation

## 2024-05-19 MED ORDER — LIDOCAINE HCL (PF) 2 % IJ SOLN
10.0000 mL | Freq: Once | INTRAMUSCULAR | Status: AC
Start: 2024-05-19 — End: 2024-05-19
  Administered 2024-05-19: 10 mL

## 2024-05-19 MED ORDER — LIDOCAINE-EPINEPHRINE (PF) 1 %-1:200000 IJ SOLN
10.0000 mL | Freq: Once | INTRAMUSCULAR | Status: AC
Start: 2024-05-19 — End: 2024-05-19
  Administered 2024-05-19: 10 mL via INTRADERMAL

## 2024-05-19 NOTE — Progress Notes (Signed)
 PT tolerated right breast aspiration well today with NAD noted. PT verbalized understanding of discharge instructions. PT provided a work excuse note and ice packs to use at home.

## 2024-05-25 LAB — CYTOLOGY - NON PAP

## 2024-06-17 LAB — LIPID PANEL
Cholesterol: 234 mg/dL — ABNORMAL HIGH (ref <200)
HDL: 43 mg/dL — ABNORMAL LOW (ref >=50)
LDL Cholesterol (Calc): 167 mg/dL — ABNORMAL HIGH
Non-HDL Cholesterol (Calc): 191 mg/dL — ABNORMAL HIGH (ref <130)
Total CHOL/HDL Ratio: 5.4 (calc) — ABNORMAL HIGH (ref <5.0)
Triglycerides: 109 mg/dL (ref <150)

## 2024-06-17 LAB — COMPLETE METABOLIC PANEL WITHOUT GFR
AG Ratio: 1.8 (calc) (ref 1.0–2.5)
ALT: 11 U/L (ref 6–29)
AST: 12 U/L (ref 10–30)
Albumin: 4.6 g/dL (ref 3.6–5.1)
Alkaline phosphatase (APISO): 61 U/L (ref 31–125)
BUN: 13 mg/dL (ref 7–25)
CO2: 26 mmol/L (ref 20–32)
Calcium: 9.6 mg/dL (ref 8.6–10.2)
Chloride: 104 mmol/L (ref 98–110)
Creat: 0.52 mg/dL (ref 0.50–0.99)
Globulin: 2.5 g/dL (ref 1.9–3.7)
Glucose, Bld: 81 mg/dL (ref 65–139)
Potassium: 4.9 mmol/L (ref 3.5–5.3)
Sodium: 138 mmol/L (ref 135–146)
Total Bilirubin: 0.4 mg/dL (ref 0.2–1.2)
Total Protein: 7.1 g/dL (ref 6.1–8.1)

## 2024-06-17 LAB — CBC WITH DIFFERENTIAL/PLATELET
Absolute Lymphocytes: 2088 {cells}/uL (ref 850–3900)
Absolute Monocytes: 384 {cells}/uL (ref 200–950)
Basophils Absolute: 80 {cells}/uL (ref 0–200)
Basophils Relative: 1 %
Eosinophils Absolute: 248 {cells}/uL (ref 15–500)
Eosinophils Relative: 3.1 %
HCT: 41.9 % (ref 35.0–45.0)
Hemoglobin: 13.6 g/dL (ref 11.7–15.5)
MCH: 29.6 pg (ref 27.0–33.0)
MCHC: 32.5 g/dL (ref 32.0–36.0)
MCV: 91.3 fL (ref 80.0–100.0)
MPV: 11.5 fL (ref 7.5–12.5)
Monocytes Relative: 4.8 %
Neutro Abs: 5200 {cells}/uL (ref 1500–7800)
Neutrophils Relative %: 65 %
Platelets: 245 Thousand/uL (ref 140–400)
RBC: 4.59 Million/uL (ref 3.80–5.10)
RDW: 13.2 % (ref 11.0–15.0)
Total Lymphocyte: 26.1 %
WBC: 8 Thousand/uL (ref 3.8–10.8)

## 2024-06-17 LAB — TEST AUTHORIZATION

## 2024-06-17 LAB — T4, FREE: Free T4: 1.1 ng/dL (ref 0.8–1.8)

## 2024-06-17 LAB — HEPATITIS C ANTIBODY: Hepatitis C Ab: NONREACTIVE

## 2024-06-17 LAB — T3, FREE: T3, Free: 3.2 pg/mL (ref 2.3–4.2)

## 2024-06-17 LAB — TSH: TSH: 4.66 m[IU]/L — ABNORMAL HIGH

## 2024-07-02 ENCOUNTER — Encounter: Payer: Self-pay | Admitting: Family

## 2024-07-02 ENCOUNTER — Ambulatory Visit (INDEPENDENT_AMBULATORY_CARE_PROVIDER_SITE_OTHER): Admitting: Family

## 2024-07-02 VITALS — BP 100/64 | HR 82 | Temp 98.0°F | Ht 59.0 in | Wt 132.6 lb

## 2024-07-02 DIAGNOSIS — E782 Mixed hyperlipidemia: Secondary | ICD-10-CM

## 2024-07-02 DIAGNOSIS — H538 Other visual disturbances: Secondary | ICD-10-CM | POA: Diagnosis not present

## 2024-07-02 DIAGNOSIS — L0293 Carbuncle, unspecified: Secondary | ICD-10-CM

## 2024-07-02 MED ORDER — DOXYCYCLINE HYCLATE 100 MG PO TABS: 100.0000 mg | ORAL_TABLET | Freq: Two times a day (BID) | ORAL | 0 refills | Status: AC

## 2024-07-09 NOTE — Progress Notes (Signed)
 Provider: Roxan Plough FNP-C  Keisy Strickler, Roxan BROCKS, NP  Patient Care Team: Nomie Buchberger, Roxan BROCKS, NP as PCP - General (Family Medicine)  Extended Emergency Contact Information Primary Emergency Contact: NEESCE,MICKEY  [place] Mobile Phone: [phone] Relation: Spouse Secondary Emergency Contact: Levert Deal Address: [address]          [place], [place] [address] [place] Home Phone: [phone] Relation: Mother  Code Status: Full code Goals of care: Advanced Directive information    08/09/2022   12:30 PM  Advanced Directives  Does Patient Have a Medical Advance Directive? No  Would patient like information on creating a medical advance directive? No - Patient declined     Chief Complaint  Patient presents with   Rash    patient coming in for a rash on the pubic    Discussed the use of AI scribe software for clinical note transcription with the patient, who gave verbal consent to proceed.  History of Present Illness   Carmen Wheeler is a 38 year old female who presents with blistery skin lesions after shaving.  She developed blistery skin lesions with clear liquid on the side of her Pubic area after shaving. This is the first occurrence despite regular shaving in the past without issues. The lesions are described as 'really red.' No changes in shaving vessel or technique. The lesions are localized to the pubic area, with no similar issues in other areas such as the armpits. No fever or chills.  She experiences blurry vision, which she attributes to not wearing glasses prescribed about two to three years ago. She has not worn them consistently. No recent eye examinations.  She is currently on medication for cholesterol management but finds it challenging to remember to take her pills consistently. She has also started taking some vitamins. No allergies to medications and she is not pregnant or nursing.   Past Medical  History:  Diagnosis Date   Anemia    Migraine    Renal disorder    Past Surgical History:  Procedure Laterality Date   CESAREAN SECTION  2005   CESAREAN SECTION N/A 06/08/2018   Procedure: REPEAT CESAREAN SECTION;  Surgeon: Henry Slough, MD;  Location: [facility] BIRTHING SUIT[facility];  Service: Obstetrics;  Laterality: N/A;  RNFA Requested  Tracey   CESAREAN SECTION  2002   CESAREAN SECTION  2008   WISDOM TOOTH EXTRACTION      No Known Allergies  Outpatient Encounter Medications as of 07/02/2024  Medication Sig   atorvastatin  (LIPITOR) 10 MG tablet Take 1 tablet (10 mg total) by mouth daily.   doxycycline  (VIBRA -TABS) 100 MG tablet Take 1 tablet (100 mg total) by mouth 2 (two) times daily for 7 days.   ibuprofen  (ADVIL ,MOTRIN ) 600 MG tablet Take 1 tablet (600 mg total) by mouth every 6 (six) hours.   levonorgestrel (MIRENA) 20 MCG/DAY IUD 1 each by Intrauterine route once.   rizatriptan  (MAXALT ) 10 MG tablet TAKE 1 TABLET BY MOUTH AS NEEDED FOR MIGRAINE. MAY REPEAT IN 2 HOURS IF NEEDED. MAX DOSE 2 PILLS IN 24 HOURS   [DISCONTINUED] Multiple Vitamins-Minerals (MULTIVITAMIN WITH MINERALS) tablet Take 1 tablet by mouth daily.   baclofen  (LIORESAL ) 10 MG tablet Take 1 tablet (10 mg total) by mouth 2 (two) times daily as needed for muscle spasms.   Galcanezumab -gnlm (EMGALITY ) 120 MG/ML SOAJ Inject 1 Pen into the skin every 30 (thirty) days.   No facility-administered encounter medications on file as of 07/02/2024.  Review of Systems  Constitutional:  Negative for appetite change, chills, fatigue, fever and unexpected weight change.  Eyes:  Negative for pain, discharge, redness, itching and visual disturbance.       Blurry vision due to not wearing eye glasses   Respiratory:  Negative for cough, chest tightness, shortness of breath and wheezing.   Cardiovascular:  Negative for chest pain, palpitations and leg swelling.  Endocrine: Negative for cold intolerance, heat intolerance, polydipsia,  polyphagia and polyuria.  Genitourinary:  Negative for difficulty urinating, dysuria, flank pain, frequency and urgency.  Musculoskeletal:  Negative for arthralgias, back pain, gait problem, joint swelling and myalgias.  Skin:  Positive for rash. Negative for color change, pallor and wound.       Rash on pubic area   Neurological:  Negative for dizziness, weakness, light-headedness, numbness and headaches.    Immunization History  Administered Date(s) Administered   Influenza-Unspecified 05/20/2023   PPD Test 06/07/2023   Pfizer(Comirnaty)Fall Seasonal Vaccine 12 years and older 01/04/2020, 01/25/2020, 09/15/2020   Tdap 01/15/2024   Pertinent  Health Maintenance Due  Topic Date Due   Influenza Vaccine  03/13/2024   Mammogram  05/12/2026      09/15/2019    5:24 PM 03/21/2020   11:33 AM 01/12/2022    5:50 PM 08/09/2022   12:30 PM 01/15/2024   11:21 AM  Fall Risk  Falls in the past year?     0  Was there an injury with Fall?     0  Fall Risk Category Calculator     0  (RETIRED) Patient Fall Risk Level Low fall risk  Low fall risk  Low fall risk  Low fall risk    Patient at Risk for Falls Due to     No Fall Risks  Fall risk Follow up     Falls evaluation completed     Data saved with a previous flowsheet row definition   Functional Status Survey:  Vitals:   07/02/24 1524  BP: 100/64  Pulse: 82  Temp: 98 F (36.7 C)  SpO2: 99%  Weight: 132 lb 9.6 oz (60.1 kg)  Height: 4' 11 (1.499 m)   Body mass index is 26.78 kg/m. Physical Exam  VITALS: T- 98.0, P- 82, BP- 100/64, SaO2- 99% MEASUREMENTS: Weight- 132.6. GENERAL: Alert, cooperative, well developed, no acute distress. HEENT: Normocephalic, normal oropharynx, moist mucous membranes. CHEST: Clear to auscultation bilaterally, no wheezes, rhonchi, or crackles. CARDIOVASCULAR: Normal heart rate and rhythm, S1 and S2 normal without murmurs. ABDOMEN: Soft, non-tender, non-distended, without organomegaly, normal bowel  sounds. EXTREMITIES: No cyanosis or edema. NEUROLOGICAL: Cranial nerves grossly intact, moves all extremities without gross motor or sensory deficit. SKIN: Localized blistering with clear liquid on the side, no lesions on armpits.   Labs reviewed: Recent Labs    01/06/24 1205 01/15/24 1217  NA 139 138  K 4.4 4.9  CL 106 104  CO2 26 26  GLUCOSE 83 81  BUN 9 13  CREATININE 0.63 0.52  CALCIUM  9.0 9.6   Recent Labs    01/06/24 1205 01/15/24 1217 02/17/24 0831  AST 16 12 12   ALT 13 11 15   ALKPHOS 55  --   --   BILITOT 0.3 0.4 0.3  PROT 6.4* 7.1 6.9  ALBUMIN 3.4*  --   --    Recent Labs    01/06/24 1205 01/15/24 1217  WBC 7.0 8.0  NEUTROABS 4.1 5,200  HGB 12.9 13.6  HCT 39.5 41.9  MCV 90.4 91.3  PLT 263 245   Lab Results  Component Value Date   TSH 4.66 (H) 01/15/2024   No results found for: HGBA1C Lab Results  Component Value Date   CHOL 159 05/18/2024   HDL 38 (L) 05/18/2024   LDLCALC 104 (H) 05/18/2024   TRIG 76 05/18/2024   CHOLHDL 4.2 05/18/2024    Significant Diagnostic Results in last 30 days:  No results found.  Assessment/Plan  Carbuncle of skin Carbuncle on the pubic area likely due to infected hair follicles from shaving. No fever or chills. No recent changes in shaving products or practices. No shared razors or towels. - Prescribed doxycycline , one pill in the morning and one in the evening for 7 days. - Advised against shaving or waxing until the infection clears. - Recommended using antibacterial soap like Dio before shaving. - Advised avoiding sun exposure while on doxycycline  to prevent skin thinning. - Suggested consuming yogurt or probiotics to prevent antibiotic-associated diarrhea.  Blurry vision Possibly due to not wearing prescribed glasses. Blood pressure and oxygen levels are normal, ruling out hypertension and hypoxia as causes. Blood sugar levels should be stable to prevent vision changes. - Recommended scheduling an eye  doctor appointment for a vision check and potential prescription update. - Advised maintaining stable blood sugar levels to prevent vision changes.  Hyperlipidemia Currently on cholesterol medication but has difficulty remembering to take it consistently. Recently restarted medication and is also taking vitamins. - Continue cholesterol medication as prescribed. - Encouraged adherence to medication regimen.   Family/ staff Communication: Reviewed plan of care with patient verbalized understanding  Labs/tests ordered: None   Next Appointment: Return if symptoms worsen or fail to improve.   Total time: 20 minutes. Greater than 50% of total time spent doing patient education regarding Rash,Hyperlipidemia,Blurry vision,Carbuncle of skin and health maintenance including symptom/medication management.   Roxan JAYSON Plough, NP

## 2024-07-16 ENCOUNTER — Other Ambulatory Visit: Payer: Self-pay | Admitting: Family

## 2024-07-16 DIAGNOSIS — E7800 Pure hypercholesterolemia, unspecified: Secondary | ICD-10-CM

## 2024-07-16 NOTE — Telephone Encounter (Signed)
 Pharmacy requested refill.  Pended Rx and sent to [facility] for approval due to HIGH ALERT Warning.

## 2024-08-05 ENCOUNTER — Encounter: Payer: Self-pay | Admitting: Family

## 2024-08-05 ENCOUNTER — Ambulatory Visit: Admitting: Family

## 2024-08-05 VITALS — BP 98/62 | HR 71 | Temp 97.3°F | Ht 59.0 in | Wt 128.6 lb

## 2024-08-05 DIAGNOSIS — Z23 Encounter for immunization: Secondary | ICD-10-CM

## 2024-08-05 DIAGNOSIS — R21 Rash and other nonspecific skin eruption: Secondary | ICD-10-CM | POA: Diagnosis not present

## 2024-08-05 MED ORDER — DOXYCYCLINE HYCLATE 100 MG PO TABS: 100.0000 mg | ORAL_TABLET | Freq: Two times a day (BID) | ORAL | 0 refills | Status: AC

## 2024-08-05 NOTE — Patient Instructions (Signed)
 Patient needs to schedule pap smear.

## 2024-08-05 NOTE — Progress Notes (Signed)
 "  Provider: Tery Hoeger FNP-C  Carmen Wheeler, Carmen BROCKS, NP  Patient Care Team: Emmajane Altamura, Carmen BROCKS, NP as PCP - General (Family Medicine)  Extended Emergency Contact Information Primary Emergency Contact: NEESCE,MICKEY  [place] Mobile Phone: [phone] Relation: Spouse Secondary Emergency Contact: Levert Deal Address: [address]          [place], [place] [address] [place] Home Phone: [phone] Relation: Mother  Code Status:  full Code  Goals of care: Advanced Directive information    08/09/2022   12:30 PM  Advanced Directives  Does Patient Have a Medical Advance Directive? No  Would patient like information on creating a medical advance directive? No - Patient declined     Chief Complaint  Patient presents with   Rash    HPI:  Pt is a 38 y.o. female seen today for an acute visit for evaluation of rash over pubic area after shaving. States developed red bumps.No drainage,fever or chills.Has had similar rash in the past with shaving,treated with antibiotics. No changes in soap, lotion or laundry detergent.  Past Medical History:  Diagnosis Date   Anemia    Migraine    Renal disorder    Past Surgical History:  Procedure Laterality Date   CESAREAN SECTION  2005   CESAREAN SECTION N/A 06/08/2018   Procedure: REPEAT CESAREAN SECTION;  Surgeon: Henry Slough, MD;  Location: [facility] BIRTHING SUIT[facility];  Service: Obstetrics;  Laterality: N/A;  RNFA Requested  Tracey   CESAREAN SECTION  2002   CESAREAN SECTION  2008   WISDOM TOOTH EXTRACTION      Allergies[1]  Outpatient Encounter Medications as of 08/05/2024  Medication Sig   atorvastatin  (LIPITOR) 10 MG tablet TAKE 1 TABLET BY MOUTH EVERY DAY   doxycycline  (VIBRA -TABS) 100 MG tablet Take 1 tablet (100 mg total) by mouth 2 (two) times daily for 7 days.   Galcanezumab -gnlm (EMGALITY ) 120 MG/ML SOAJ Inject 1 Pen into the skin every 30 (thirty) days.   ibuprofen  (ADVIL ,MOTRIN )  600 MG tablet Take 1 tablet (600 mg total) by mouth every 6 (six) hours.   levonorgestrel (MIRENA) 20 MCG/DAY IUD 1 each by Intrauterine route once.   rizatriptan  (MAXALT ) 10 MG tablet TAKE 1 TABLET BY MOUTH AS NEEDED FOR MIGRAINE. MAY REPEAT IN 2 HOURS IF NEEDED. MAX DOSE 2 PILLS IN 24 HOURS   No facility-administered encounter medications on file as of 08/05/2024.    Review of Systems  Constitutional:  Negative for appetite change, chills, fatigue, fever and unexpected weight change.  Eyes:  Negative for pain, discharge, redness, itching and visual disturbance.  Respiratory:  Negative for cough, chest tightness, shortness of breath and wheezing.   Cardiovascular:  Negative for chest pain, palpitations and leg swelling.  Genitourinary:  Negative for difficulty urinating, dysuria, flank pain, frequency and urgency.  Musculoskeletal:  Negative for arthralgias, back pain, gait problem, joint swelling, myalgias, neck pain and neck stiffness.  Skin:  Negative for color change, pallor, rash and wound.  Neurological:  Negative for dizziness, weakness, light-headedness, numbness and headaches.    Immunization History  Administered Date(s) Administered   Influenza, Seasonal, Injecte, Preservative Fre 08/05/2024   Influenza-Unspecified 05/20/2023   PPD Test 06/07/2023   Pfizer(Comirnaty)Fall Seasonal Vaccine 12 years and older 01/04/2020, 01/25/2020, 09/15/2020   Tdap 01/15/2024   Pertinent  Health Maintenance Due  Topic Date Due   Mammogram  05/12/2026   Influenza Vaccine  Completed      03/21/2020   11:33 AM 01/12/2022  5:50 PM 08/09/2022   12:30 PM 01/15/2024   11:21 AM 08/05/2024   10:17 AM  Fall Risk  Falls in the past year?    0 0  Was there an injury with Fall?    0  0  Fall Risk Category Calculator    0 0  (RETIRED) Patient Fall Risk Level Low fall risk  Low fall risk  Low fall risk     Patient at Risk for Falls Due to    No Fall Risks No Fall Risks  Fall risk Follow up     Falls evaluation completed Falls evaluation completed     Data saved with a previous flowsheet row definition   Functional Status Survey:    Vitals:   08/05/24 1019  BP: 98/62  Pulse: 71  Temp: (!) 97.3 F (36.3 C)  SpO2: 99%  Weight: 128 lb 9.6 oz (58.3 kg)  Height: 4' 11 (1.499 m)   Body mass index is 25.97 kg/m. Physical Exam Vitals reviewed.  Constitutional:      General: She is not in acute distress.    Appearance: Normal appearance. She is overweight. She is not ill-appearing or diaphoretic.  HENT:     Head: Normocephalic.  Cardiovascular:     Rate and Rhythm: Normal rate and regular rhythm.     Pulses: Normal pulses.     Heart sounds: Normal heart sounds. No murmur heard.    No friction rub. No gallop.  Pulmonary:     Effort: Pulmonary effort is normal. No respiratory distress.     Breath sounds: Normal breath sounds. No wheezing, rhonchi or rales.  Chest:     Chest wall: No tenderness.  Abdominal:     General: Bowel sounds are normal. There is no distension.     Palpations: Abdomen is soft. There is no mass.     Tenderness: There is no abdominal tenderness. There is no right CVA tenderness, left CVA tenderness, guarding or rebound.  Skin:    General: Skin is warm and dry.     Coloration: Skin is not pale.     Findings: Rash present. No bruising, erythema or lesion.     Comments: Raised red papule rash scattered over the pubic areas x 3 areas.hard to palpate but none tender.    Neurological:     Mental Status: She is alert and oriented to person, place, and time.     Motor: No weakness.     Gait: Gait normal.  Psychiatric:        Mood and Affect: Mood normal.        Speech: Speech normal.        Behavior: Behavior normal.      Labs reviewed: Recent Labs    01/06/24 1205 01/15/24 1217  NA 139 138  K 4.4 4.9  CL 106 104  CO2 26 26  GLUCOSE 83 81  BUN 9 13  CREATININE 0.63 0.52  CALCIUM  9.0 9.6   Recent Labs    01/06/24 1205 01/15/24 1217  02/17/24 0831  AST 16 12 12   ALT 13 11 15   ALKPHOS 55  --   --   BILITOT 0.3 0.4 0.3  PROT 6.4* 7.1 6.9  ALBUMIN 3.4*  --   --    Recent Labs    01/06/24 1205 01/15/24 1217  WBC 7.0 8.0  NEUTROABS 4.1 5,200  HGB 12.9 13.6  HCT 39.5 41.9  MCV 90.4 91.3  PLT 263 245   Lab Results  Component Value Date   TSH 4.66 (H) 01/15/2024   No results found for: HGBA1C Lab Results  Component Value Date   CHOL 159 05/18/2024   HDL 38 (L) 05/18/2024   LDLCALC 104 (H) 05/18/2024   TRIG 76 05/18/2024   CHOLHDL 4.2 05/18/2024    Significant Diagnostic Results in last 30 days:  No results found.  Assessment/Plan  1. Rash and nonspecific skin eruption (Primary) Afebrile  Raised red papule rash scattered over the pubic areas x 3 areas.hard to palpate but none tender.   - Advised to use antibacterial soap -Avoid frequent shaving may consider soft gillette shaver to avoid skin irritation.  - doxycycline  (VIBRA -TABS) 100 MG tablet; Take 1 tablet (100 mg total) by mouth 2 (two) times daily for 7 days.  Dispense: 14 tablet; Refill: 0  2. Need for vaccination Afebrile Flut shot administered by CMA no acute reaction reported.  - Flu vaccine trivalent PF, 6mos and older(Flulaval,Afluria,Fluarix,Fluzone)  Family/ staff Communication: Reviewed plan of care with patient verbalized understanding  Labs/tests ordered: None   Next Appointment: Return if symptoms worsen or fail to improve.   Total time: 20 minutes. Greater than 50% of total time spent doing patient education regarding Rash and health maintenance including symptom/medication management.   Carmen Wheeler Carmen Stiverson, NP    [1] No Known Allergies  "

## 2024-09-22 ENCOUNTER — Telehealth: Admitting: Adult Health

## 2024-09-22 ENCOUNTER — Telehealth: Payer: 59 | Admitting: Adult Health

## 2024-10-19 ENCOUNTER — Encounter: Admitting: Adult Health

## 2024-12-22 ENCOUNTER — Telehealth: Admitting: Adult Health

## 2025-01-14 ENCOUNTER — Ambulatory Visit: Payer: Self-pay | Admitting: Family

## 2025-01-19 ENCOUNTER — Encounter: Admitting: Family
